# Patient Record
Sex: Female | Born: 1955 | Race: White | Hispanic: No | Smoking: Current every day smoker
Health system: Southern US, Community
[De-identification: ages and names within clinical notes are randomized; demographics above are authoritative.]

## PROBLEM LIST (undated history)

## (undated) DIAGNOSIS — G43909 Migraine, unspecified, not intractable, without status migrainosus: Secondary | ICD-10-CM

## (undated) DIAGNOSIS — M1990 Unspecified osteoarthritis, unspecified site: Secondary | ICD-10-CM

## (undated) DIAGNOSIS — K900 Celiac disease: Secondary | ICD-10-CM

## (undated) DIAGNOSIS — M810 Age-related osteoporosis without current pathological fracture: Secondary | ICD-10-CM

## (undated) DIAGNOSIS — F17200 Nicotine dependence, unspecified, uncomplicated: Secondary | ICD-10-CM

## (undated) HISTORY — PX: TUBAL LIGATION: SHX77

## (undated) HISTORY — PX: ABDOMINAL HYSTERECTOMY: SHX81

---

## 2017-11-12 ENCOUNTER — Emergency Department: Payer: No Typology Code available for payment source

## 2017-11-12 ENCOUNTER — Encounter: Payer: Self-pay | Admitting: *Deleted

## 2017-11-12 ENCOUNTER — Other Ambulatory Visit: Payer: Self-pay

## 2017-11-12 DIAGNOSIS — Y999 Unspecified external cause status: Secondary | ICD-10-CM | POA: Diagnosis not present

## 2017-11-12 DIAGNOSIS — F17210 Nicotine dependence, cigarettes, uncomplicated: Secondary | ICD-10-CM | POA: Insufficient documentation

## 2017-11-12 DIAGNOSIS — S2223XA Sternal manubrial dissociation, initial encounter for closed fracture: Secondary | ICD-10-CM | POA: Insufficient documentation

## 2017-11-12 DIAGNOSIS — M25532 Pain in left wrist: Secondary | ICD-10-CM | POA: Diagnosis present

## 2017-11-12 DIAGNOSIS — Y939 Activity, unspecified: Secondary | ICD-10-CM | POA: Insufficient documentation

## 2017-11-12 DIAGNOSIS — Y929 Unspecified place or not applicable: Secondary | ICD-10-CM | POA: Insufficient documentation

## 2017-11-12 LAB — BASIC METABOLIC PANEL
ANION GAP: 8 (ref 5–15)
BUN: 15 mg/dL (ref 6–20)
CALCIUM: 9.4 mg/dL (ref 8.9–10.3)
CO2: 22 mmol/L (ref 22–32)
Chloride: 107 mmol/L (ref 101–111)
Creatinine, Ser: 0.68 mg/dL (ref 0.44–1.00)
GFR calc Af Amer: >60 mL/min (ref >60)
GFR calc non Af Amer: >60 mL/min (ref >60)
GLUCOSE: 98 mg/dL (ref 65–99)
POTASSIUM: 4.1 mmol/L (ref 3.5–5.1)
Sodium: 137 mmol/L (ref 135–145)

## 2017-11-12 LAB — CBC
HEMATOCRIT: 41.8 % (ref 35.0–47.0)
HEMOGLOBIN: 14.3 g/dL (ref 12.0–16.0)
MCH: 29.9 pg (ref 26.0–34.0)
MCHC: 34.2 g/dL (ref 32.0–36.0)
MCV: 87.4 fL (ref 80.0–100.0)
Platelets: 477 10*3/uL — ABNORMAL HIGH (ref 150–440)
RBC: 4.79 MIL/uL (ref 3.80–5.20)
RDW: 13.5 % (ref 11.5–14.5)
WBC: 9.4 10*3/uL (ref 3.6–11.0)

## 2017-11-12 LAB — TROPONIN I: Troponin I: <0.03 ng/mL (ref <0.03)
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
## 2017-11-12 NOTE — ED Triage Notes (Signed)
Pt back from X-ray reports she needs to make a phone call stepped outside pt walked with steady gait no distress noted

## 2017-11-13 ENCOUNTER — Emergency Department
Admission: EM | Admit: 2017-11-13 | Discharge: 2017-11-13 | Disposition: A | Discharge status: Home / Self Care | Payer: No Typology Code available for payment source | Attending: Emergency Medicine | Admitting: Emergency Medicine

## 2017-11-13 ENCOUNTER — Emergency Department: Payer: No Typology Code available for payment source

## 2017-11-13 ENCOUNTER — Encounter: Payer: Self-pay | Admitting: Radiology

## 2017-11-13 DIAGNOSIS — R0789 Other chest pain: Secondary | ICD-10-CM

## 2017-11-13 DIAGNOSIS — S2223XA Sternal manubrial dissociation, initial encounter for closed fracture: Secondary | ICD-10-CM

## 2017-11-13 HISTORY — DX: Unspecified osteoarthritis, unspecified site: M19.90

## 2017-11-13 HISTORY — DX: Celiac disease: K90.0

## 2017-11-13 HISTORY — DX: Age-related osteoporosis without current pathological fracture: M81.0

## 2017-11-13 HISTORY — DX: Migraine, unspecified, not intractable, without status migrainosus: G43.909

## 2017-11-13 LAB — ETHANOL

## 2017-11-13 MED ORDER — IOPAMIDOL (ISOVUE-300) INJECTION 61%
75.0000 mL | Freq: Once | INTRAVENOUS | Status: AC | PRN
  Administered 2017-11-13: 75 mL via INTRAVENOUS

## 2017-11-13 MED ORDER — OXYCODONE-ACETAMINOPHEN 5-325 MG PO TABS: 2.0000 | ORAL_TABLET | ORAL | 0 refills | Status: DC | PRN

## 2017-11-13 MED ORDER — KETOROLAC TROMETHAMINE 30 MG/ML IJ SOLN
30.0000 mg | Freq: Once | INTRAMUSCULAR | Status: AC
  Administered 2017-11-13: 30 mg via INTRAVENOUS
  Filled 2017-11-13: qty 1

## 2017-11-13 MED ORDER — OXYCODONE-ACETAMINOPHEN 5-325 MG PO TABS
1.0000 | ORAL_TABLET | Freq: Once | ORAL | Status: AC
  Administered 2017-11-13: 1 via ORAL
  Filled 2017-11-13: qty 1

## 2017-11-13 NOTE — Discharge Instructions (Signed)
Please follow up with your primary care physician for further evaluation of your symptoms.  °
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
## 2024-09-30 ENCOUNTER — Observation Stay: Admission: EM | Admit: 2024-09-30 | Discharge: 2024-10-03 | Disposition: A | Discharge status: Skilled Nursing Facility

## 2024-09-30 ENCOUNTER — Encounter: Payer: Self-pay | Admitting: Emergency Medicine

## 2024-09-30 ENCOUNTER — Emergency Department

## 2024-09-30 ENCOUNTER — Other Ambulatory Visit: Payer: Self-pay

## 2024-09-30 DIAGNOSIS — F319 Bipolar disorder, unspecified: Secondary | ICD-10-CM | POA: Diagnosis not present

## 2024-09-30 DIAGNOSIS — D169 Benign neoplasm of bone and articular cartilage, unspecified: Secondary | ICD-10-CM

## 2024-09-30 DIAGNOSIS — Y92009 Unspecified place in unspecified non-institutional (private) residence as the place of occurrence of the external cause: Secondary | ICD-10-CM | POA: Insufficient documentation

## 2024-09-30 DIAGNOSIS — M25571 Pain in right ankle and joints of right foot: Secondary | ICD-10-CM | POA: Diagnosis present

## 2024-09-30 DIAGNOSIS — S82841A Displaced bimalleolar fracture of right lower leg, initial encounter for closed fracture: Principal | ICD-10-CM | POA: Insufficient documentation

## 2024-09-30 DIAGNOSIS — M810 Age-related osteoporosis without current pathological fracture: Secondary | ICD-10-CM | POA: Insufficient documentation

## 2024-09-30 DIAGNOSIS — K900 Celiac disease: Secondary | ICD-10-CM | POA: Diagnosis not present

## 2024-09-30 DIAGNOSIS — W19XXXA Unspecified fall, initial encounter: Secondary | ICD-10-CM | POA: Insufficient documentation

## 2024-09-30 DIAGNOSIS — F17200 Nicotine dependence, unspecified, uncomplicated: Secondary | ICD-10-CM | POA: Diagnosis not present

## 2024-09-30 DIAGNOSIS — G43909 Migraine, unspecified, not intractable, without status migrainosus: Secondary | ICD-10-CM | POA: Diagnosis not present

## 2024-09-30 DIAGNOSIS — S82891A Other fracture of right lower leg, initial encounter for closed fracture: Principal | ICD-10-CM | POA: Diagnosis present

## 2024-09-30 HISTORY — DX: Nicotine dependence, unspecified, uncomplicated: F17.200

## 2024-09-30 MED ORDER — SODIUM CHLORIDE 0.9 % IV BOLUS
500.0000 mL | Freq: Once | INTRAVENOUS | Status: AC
  Administered 2024-09-30: 500 mL via INTRAVENOUS

## 2024-09-30 MED ORDER — LORAZEPAM 2 MG/ML IJ SOLN
1.0000 mg | Freq: Once | INTRAMUSCULAR | Status: AC
  Administered 2024-10-01: 1 mg via INTRAVENOUS
  Filled 2024-09-30: qty 1

## 2024-09-30 MED ORDER — FENTANYL CITRATE (PF) 50 MCG/ML IJ SOSY
50.0000 ug | PREFILLED_SYRINGE | Freq: Once | INTRAMUSCULAR | Status: AC
  Administered 2024-09-30: 50 ug via INTRAVENOUS
  Filled 2024-09-30: qty 1

## 2024-09-30 MED ORDER — OXYCODONE-ACETAMINOPHEN 5-325 MG PO TABS
2.0000 | ORAL_TABLET | ORAL | Status: AC
  Administered 2024-09-30: 2 via ORAL
  Filled 2024-09-30: qty 2

## 2024-09-30 NOTE — ED Notes (Signed)
 Pt given gluten free chips and Debarge sauce as snack per request. Pt also asking for anxiety meds

## 2024-09-30 NOTE — ED Notes (Signed)
 Assisted pt with bedpan. Call light within reach. No further needs a this time.

## 2024-09-30 NOTE — ED Triage Notes (Addendum)
 Pt in from home via [facility] with RLE injury after slip fall in her kitchen tonight. Pt denies any LOC or thinners, denies any head/neck pain. Pt arrives with R ankle deformity and swelling, was unable to bear weight to RLE on scene. +R pedal pulse present, given 75mcg Fentanyl  PTA

## 2024-10-01 ENCOUNTER — Inpatient Hospital Stay

## 2024-10-01 ENCOUNTER — Encounter: Admission: EM | Disposition: A | Discharge status: Skilled Nursing Facility | Payer: Self-pay | Source: Home / Self Care

## 2024-10-01 ENCOUNTER — Encounter: Payer: Self-pay | Admitting: Internal Medicine

## 2024-10-01 ENCOUNTER — Observation Stay

## 2024-10-01 ENCOUNTER — Inpatient Hospital Stay: Admitting: Anesthesiology

## 2024-10-01 DIAGNOSIS — Y92009 Unspecified place in unspecified non-institutional (private) residence as the place of occurrence of the external cause: Secondary | ICD-10-CM | POA: Diagnosis not present

## 2024-10-01 DIAGNOSIS — F319 Bipolar disorder, unspecified: Secondary | ICD-10-CM | POA: Diagnosis present

## 2024-10-01 DIAGNOSIS — G43909 Migraine, unspecified, not intractable, without status migrainosus: Secondary | ICD-10-CM | POA: Diagnosis present

## 2024-10-01 DIAGNOSIS — S82891A Other fracture of right lower leg, initial encounter for closed fracture: Secondary | ICD-10-CM

## 2024-10-01 DIAGNOSIS — F17200 Nicotine dependence, unspecified, uncomplicated: Secondary | ICD-10-CM

## 2024-10-01 DIAGNOSIS — K900 Celiac disease: Secondary | ICD-10-CM | POA: Diagnosis present

## 2024-10-01 DIAGNOSIS — W19XXXA Unspecified fall, initial encounter: Secondary | ICD-10-CM

## 2024-10-01 HISTORY — PX: ORIF ANKLE FRACTURE: SHX5408

## 2024-10-01 LAB — BASIC METABOLIC PANEL WITH GFR
Anion gap: 7 (ref 5–15)
BUN: 12 mg/dL (ref 8–23)
CO2: 26 mmol/L (ref 22–32)
Calcium: 8.8 mg/dL — ABNORMAL LOW (ref 8.9–10.3)
Chloride: 104 mmol/L (ref 98–111)
Creatinine, Ser: 0.73 mg/dL (ref 0.44–1.00)
GFR, Estimated: >60 mL/min
Glucose, Bld: 94 mg/dL (ref 70–99)
Potassium: 4.2 mmol/L (ref 3.5–5.1)
Sodium: 137 mmol/L (ref 135–145)

## 2024-10-01 LAB — CBC
HCT: 37.4 % (ref 36.0–46.0)
Hemoglobin: 12.4 g/dL (ref 12.0–15.0)
MCH: 29.3 pg (ref 26.0–34.0)
MCHC: 33.2 g/dL (ref 30.0–36.0)
MCV: 88.4 fL (ref 80.0–100.0)
Platelets: 496 K/uL — ABNORMAL HIGH (ref 150–400)
RBC: 4.23 MIL/uL (ref 3.87–5.11)
RDW: 13.8 % (ref 11.5–15.5)
WBC: 10.8 K/uL — ABNORMAL HIGH (ref 4.0–10.5)
nRBC: 0 % (ref 0.0–0.2)

## 2024-10-01 LAB — TYPE AND SCREEN
ABO/RH(D): AB POS
Antibody Screen: NEGATIVE

## 2024-10-01 LAB — PROTIME-INR
INR: 1 (ref 0.8–1.2)
Prothrombin Time: 14 s (ref 11.4–15.2)

## 2024-10-01 LAB — APTT: aPTT: 29 s (ref 24–36)

## 2024-10-01 LAB — HIV ANTIBODY (ROUTINE TESTING W REFLEX): HIV Screen 4th Generation wRfx: NONREACTIVE

## 2024-10-01 MED ORDER — LORAZEPAM 0.5 MG PO TABS
0.5000 mg | ORAL_TABLET | Freq: Three times a day (TID) | ORAL | Status: DC | PRN
  Administered 2024-10-02 (×2): 0.5 mg via ORAL
  Filled 2024-10-01 (×2): qty 1

## 2024-10-01 MED ORDER — ACETAMINOPHEN 325 MG PO TABS: 650.0000 mg | ORAL_TABLET | Freq: Four times a day (QID) | ORAL | Status: DC | PRN

## 2024-10-01 MED ORDER — METHOCARBAMOL 500 MG PO TABS
500.0000 mg | ORAL_TABLET | Freq: Three times a day (TID) | ORAL | Status: DC | PRN
  Administered 2024-10-01 – 2024-10-02 (×2): 500 mg via ORAL
  Filled 2024-10-01 (×2): qty 1

## 2024-10-01 MED ORDER — LAMOTRIGINE 25 MG PO TABS
150.0000 mg | ORAL_TABLET | Freq: Two times a day (BID) | ORAL | Status: DC
  Administered 2024-10-01 – 2024-10-03 (×5): 150 mg via ORAL
  Filled 2024-10-01 (×5): qty 6

## 2024-10-01 MED ORDER — BUPIVACAINE HCL (PF) 0.5 % IJ SOLN
INTRAMUSCULAR | Status: DC | PRN
  Administered 2024-10-01 (×2): 10 mL via PERINEURAL

## 2024-10-01 MED ORDER — ONDANSETRON HCL 4 MG/2ML IJ SOLN
INTRAMUSCULAR | Status: DC | PRN
  Administered 2024-10-01: 4 mg via INTRAVENOUS

## 2024-10-01 MED ORDER — MIDAZOLAM HCL 2 MG/2ML IJ SOLN
INTRAMUSCULAR | Status: AC
  Filled 2024-10-01: qty 2

## 2024-10-01 MED ORDER — OXYCODONE-ACETAMINOPHEN 5-325 MG PO TABS
1.0000 | ORAL_TABLET | ORAL | Status: DC | PRN
  Administered 2024-10-01 – 2024-10-03 (×2): 1 via ORAL
  Filled 2024-10-01 (×2): qty 1

## 2024-10-01 MED ORDER — FENTANYL CITRATE (PF) 50 MCG/ML IJ SOSY
25.0000 ug | PREFILLED_SYRINGE | INTRAMUSCULAR | Status: DC | PRN
  Administered 2024-10-01: 25 ug via INTRAVENOUS
  Filled 2024-10-01: qty 1

## 2024-10-01 MED ORDER — DEXAMETHASONE SOD PHOSPHATE PF 10 MG/ML IJ SOLN
INTRAMUSCULAR | Status: DC | PRN
  Administered 2024-10-01: 10 mg via INTRAVENOUS

## 2024-10-01 MED ORDER — NICOTINE 21 MG/24HR TD PT24
21.0000 mg | MEDICATED_PATCH | Freq: Every day | TRANSDERMAL | Status: DC
  Administered 2024-10-01 – 2024-10-03 (×3): 21 mg via TRANSDERMAL
  Filled 2024-10-01 (×3): qty 1

## 2024-10-01 MED ORDER — BUPIVACAINE HCL (PF) 0.5 % IJ SOLN
INTRAMUSCULAR | Status: AC
  Filled 2024-10-01: qty 10

## 2024-10-01 MED ORDER — GLYCOPYRROLATE 0.2 MG/ML IJ SOLN
INTRAMUSCULAR | Status: DC | PRN
  Administered 2024-10-01: .2 mg via INTRAVENOUS

## 2024-10-01 MED ORDER — ONDANSETRON HCL 4 MG/2ML IJ SOLN: 4.0000 mg | Freq: Three times a day (TID) | INTRAMUSCULAR | Status: DC | PRN

## 2024-10-01 MED ORDER — PROPOFOL 10 MG/ML IV BOLUS
INTRAVENOUS | Status: AC
  Filled 2024-10-01: qty 20

## 2024-10-01 MED ORDER — LIDOCAINE 5 % EX PTCH
1.0000 | MEDICATED_PATCH | Freq: Every day | CUTANEOUS | Status: DC
  Administered 2024-10-01: 1 via TRANSDERMAL
  Filled 2024-10-01 (×3): qty 1

## 2024-10-01 MED ORDER — MIDAZOLAM HCL (PF) 2 MG/2ML IJ SOLN
1.0000 mg | INTRAMUSCULAR | Status: DC | PRN
  Administered 2024-10-01: 2 mg via INTRAVENOUS

## 2024-10-01 MED ORDER — PHENYLEPHRINE HCL-NACL 20-0.9 MG/250ML-% IV SOLN
INTRAVENOUS | Status: AC
  Filled 2024-10-01: qty 250

## 2024-10-01 MED ORDER — FENTANYL CITRATE (PF) 50 MCG/ML IJ SOSY
PREFILLED_SYRINGE | INTRAMUSCULAR | Status: AC
  Filled 2024-10-01: qty 1

## 2024-10-01 MED ORDER — CEFAZOLIN SODIUM-DEXTROSE 2-4 GM/100ML-% IV SOLN
2.0000 g | INTRAVENOUS | Status: AC
  Administered 2024-10-01: 2 g via INTRAVENOUS
  Filled 2024-10-01: qty 100

## 2024-10-01 MED ORDER — PHENYLEPHRINE 80 MCG/ML (10ML) SYRINGE FOR IV PUSH (FOR BLOOD PRESSURE SUPPORT)
PREFILLED_SYRINGE | INTRAVENOUS | Status: DC | PRN
  Administered 2024-10-01 (×3): 120 ug via INTRAVENOUS

## 2024-10-01 MED ORDER — LACTATED RINGERS IV SOLN: INTRAVENOUS | Status: AC

## 2024-10-01 MED ORDER — SEVOFLURANE IN SOLN
RESPIRATORY_TRACT | Status: AC
  Filled 2024-10-01: qty 250

## 2024-10-01 MED ORDER — LIDOCAINE HCL (PF) 2 % IJ SOLN
INTRAMUSCULAR | Status: AC
  Filled 2024-10-01: qty 5

## 2024-10-01 MED ORDER — FENTANYL CITRATE (PF) 100 MCG/2ML IJ SOLN
INTRAMUSCULAR | Status: DC | PRN
  Administered 2024-10-01: 25 ug via INTRAVENOUS

## 2024-10-01 MED ORDER — LORAZEPAM 2 MG/ML IJ SOLN
1.0000 mg | Freq: Once | INTRAMUSCULAR | Status: AC
  Administered 2024-10-01: 1 mg via INTRAVENOUS
  Filled 2024-10-01: qty 1

## 2024-10-01 MED ORDER — PHENYLEPHRINE HCL-NACL 20-0.9 MG/250ML-% IV SOLN
INTRAVENOUS | Status: DC | PRN
  Administered 2024-10-01: 20 ug/min via INTRAVENOUS

## 2024-10-01 MED ORDER — SUMATRIPTAN SUCCINATE 50 MG PO TABS
50.0000 mg | ORAL_TABLET | ORAL | Status: DC | PRN
  Filled 2024-10-01: qty 1

## 2024-10-01 MED ORDER — BUPIVACAINE HCL (PF) 0.5 % IJ SOLN: INTRAMUSCULAR | Status: DC | PRN

## 2024-10-01 MED ORDER — GADOBUTROL 1 MMOL/ML IV SOLN
7.5000 mL | Freq: Once | INTRAVENOUS | Status: AC | PRN
  Administered 2024-10-01: 7.5 mL via INTRAVENOUS

## 2024-10-01 MED ORDER — ROCURONIUM BROMIDE 10 MG/ML (PF) SYRINGE
PREFILLED_SYRINGE | INTRAVENOUS | Status: AC
  Filled 2024-10-01: qty 10

## 2024-10-01 MED ORDER — LIDOCAINE HCL (CARDIAC) PF 100 MG/5ML IV SOSY
PREFILLED_SYRINGE | INTRAVENOUS | Status: DC | PRN
  Administered 2024-10-01: 80 mg via INTRAVENOUS

## 2024-10-01 MED ORDER — EPHEDRINE SULFATE-NACL 50-0.9 MG/10ML-% IV SOSY
PREFILLED_SYRINGE | INTRAVENOUS | Status: DC | PRN
  Administered 2024-10-01: 10 mg via INTRAVENOUS

## 2024-10-01 MED ORDER — FENTANYL CITRATE (PF) 100 MCG/2ML IJ SOLN
INTRAMUSCULAR | Status: AC
  Filled 2024-10-01: qty 2

## 2024-10-01 MED ORDER — CEFAZOLIN SODIUM-DEXTROSE 2-4 GM/100ML-% IV SOLN
INTRAVENOUS | Status: AC
  Filled 2024-10-01: qty 100

## 2024-10-01 MED ORDER — LACTATED RINGERS IV SOLN: INTRAVENOUS | Status: DC | PRN

## 2024-10-01 MED ORDER — SENNOSIDES-DOCUSATE SODIUM 8.6-50 MG PO TABS: 1.0000 | ORAL_TABLET | Freq: Every evening | ORAL | Status: DC | PRN

## 2024-10-01 MED ORDER — BUPIVACAINE LIPOSOME 1.3 % IJ SUSP
INTRAMUSCULAR | Status: DC | PRN
  Administered 2024-10-01 (×2): 10 mL via PERINEURAL

## 2024-10-01 MED ORDER — FENTANYL CITRATE (PF) 50 MCG/ML IJ SOSY
50.0000 ug | PREFILLED_SYRINGE | Freq: Once | INTRAMUSCULAR | Status: AC
  Administered 2024-10-01: 50 ug via INTRAVENOUS

## 2024-10-01 MED ORDER — 0.9 % SODIUM CHLORIDE (POUR BTL) OPTIME
TOPICAL | Status: DC | PRN
  Administered 2024-10-01: 500 mL

## 2024-10-01 MED ORDER — PROPOFOL 10 MG/ML IV BOLUS
INTRAVENOUS | Status: DC | PRN
  Administered 2024-10-01: 150 mg via INTRAVENOUS

## 2024-10-01 NOTE — ED Provider Notes (Signed)
 "  [facility] Provider Note    Event Date/Time   First MD Initiated Contact with Patient 09/30/24 2359     (approximate)   History   Ankle Pain   HPI  Jasmine Hines is a 69 y.o. female history of celiac disease ADHD and PTSD  Patient was at her house.  She had water on the floor made cleaning.  She reports she slipped falling awkwardly with her right ankle under her.  No other pain did not strike her head she reports pain immediate in the right ankle and swelling.  She is able to feel the toes wiggle them normally but reports pain just above the right ankle where it is swollen pointing towards the area of swelling.  Reviewed external records from -  Past Medical History:  Diagnosis Date   Celiac disease    Migraines    Osteoarthritis    Osteoporosis      Physical Exam   Triage Vital Signs: ED Triage Vitals  Encounter Vitals Group     BP 09/30/24 1907 129/67     Girls Systolic BP Percentile --      Girls Diastolic BP Percentile --      Boys Systolic BP Percentile --      Boys Diastolic BP Percentile --      Pulse Rate 09/30/24 1907 78     Resp 09/30/24 1907 20     Temp 09/30/24 1907 98.2 F (36.8 C)     Temp Source 09/30/24 1907 Oral     SpO2 09/30/24 1907 100 %     Weight 09/30/24 1915 139 lb 15.9 oz (63.5 kg)     Height --      Head Circumference --      Peak Flow --      Pain Score 09/30/24 1915 10     Pain Loc --      Pain Education --      Exclude from Growth Chart --     Most recent vital signs: Vitals:   09/30/24 2300 09/30/24 2345  BP:  (!) 150/98  Pulse: 74 78  Resp:  18  Temp:    SpO2: 99% 98%     General: Awake, no distress.  Normocephalic atraumatic CV:  Strong palpable pulses right foot dorsalis pedis posterior tibial good peripheral perfusion. Normal rate and heart tones. Resp:   Normal effort. Speaking without distress. Abd:   No distention. Soft, non-tender to palpation in all quadrants. No rebound or  guarding. Neuro:   No focal neuro deficits noted. Moves extremities well without noted concern with exception of right leg which pain limits but able to wiggle toes well normal sensation of the toes of right foot. Other:  There is swelling over the distal tib-fib more laterally.  There is tenderness to palpation there able to actually support her leg and range her hip well without pain or discomfort there are no pain to palpation over the femur or knee or remaining portions of the foot with exception to the right ankle and distal tib-fib area where there is swelling.  Compartments are soft.  There is no open fracture  All toes warm and well-perfused   ED Results / Procedures / Treatments   Labs (all labs ordered are listed, but only abnormal results are displayed) Labs Reviewed - No data to display   EKG  I independently reviewed the EKG    RADIOLOGY I independently reviewed images of right tib-fib/ankle - Minimally  displaced distal fibular fracture.  Based on this also ordered CT to evaluate for additional fracture pattern and morphology   CT 1. Oblique fracture of the lateral tibial metaphysis with fracture line extending to the tibiotalar articular surface and involving the posterior malleolus, with about 1 mm cortical depression at the tibiotalar articulation. 2. Oblique fracture of the distal fibula with mild distraction and about 2 mm lateral displacement of fracture fragments. 3. Avulsion fracture off of the anterior tibial metaphysis. 4. Widening of the anterior tibiotalar joint space. 5. Avulsion fragment medial to the distal talus, likely arising from the navicular. 6. Diffuse soft tissue swelling about the ankle. No loculated collections.   PROCEDURES:  Critical Care performed: No  Procedures   MEDICATIONS ORDERED IN ED: Medications  LORazepam  (ATIVAN ) injection 1 mg (has no administration in time range)  fentaNYL  (SUBLIMAZE ) injection 50 mcg (50 mcg  Intravenous Given 09/30/24 1949)  sodium chloride  0.9 % bolus 500 mL (0 mLs Intravenous Stopped 09/30/24 2150)  oxyCODONE -acetaminophen  (PERCOCET/ROXICET) 5-325 MG per tablet 2 tablet (2 tablets Oral Given 09/30/24 2146)     IMPRESSION / MDM / ASSESSMENT AND PLAN / ED COURSE  I reviewed the triage vital signs and the nursing notes.                              Based on presentation, the differential diagnosis includes, but is not limited to key considerations: Possible acute orthopedic injury including ankle fracture seems high.  Less likely foot or knee.  No evidence of hip injury no other injury normocephalic atraumatic no neck pain did not strike head.  Reassuring neurovascular exam with soft compartments some edema over the lateral ankle  Patient's presentation is most consistent with acute complicated illness / injury requiring diagnostic workup.  Clinical Course as of 10/01/24 0006  Tue Sep 30, 2024  2040 CT imaging with relatively nondisplaced distal fibular fracture.  Will place in long-leg posterior.  Pain well-controlled.  Compartments soft throughout the lower leg.  Patient resting comfortably. [MQ]  2205 Resting comfortably.  Awaiting CT ankle to further evaluate for fracture pattern [MQ]  2206 Patient does live alone.  She reports with pain medication and crutches she think she would do well.  She would like if at all possible to have a cam walker style boot as opposed to full posterior splinting after I discussed the risks and benefits.  Plan to discuss with her podiatrist to see if this would be acceptable at this fracture pattern once we have CT imaging [MQ]  2239 Called and left message requesting callback to Dr. Lennie podiatry.  [facility] secure message sent with patient info as well [MQ]  2254 Discussed with Dr. Lennie, ok with crutches, NWB in RLE and cam walker boot.  [MQ]  2259 Discussed with podiatry Dr. Lennie.  Advises reasonable to have the patient discharge and follow-up in his  clinic closely with CAM Walker boot crutches and nonweightbearing status to right leg or she can stay in he will see her in the morning and plan surgical procedure.  I discussed with the patient she is discussing with a friend and plans to let me know which she thinks will work best based on her home care, assistance with friends, etc. [MQ]  2259 Patient understands that surgery will be recommended.  She is going to follow-up with Dr. Lennie [MQ]  2344 Patient to be admitted.  Dr. Willo discussed with hospitalist.  Have consult with podiatry Dr. Lennie he advises to have the patient n.p.o. at 7 AM and he will see her in the morning.  Patient understand agreeable with plan to move forward, Dr. Lennie advising that likely this will be operative when he plans to extend operative opportunity to her.  She has significant concerns for ongoing home as well as she lives by herself, likely have difficulty with crutches, nonweightbearing etc. [MQ]    Clinical Course User Index [MQ] Dicky Anes, MD     FINAL CLINICAL IMPRESSION(S) / ED DIAGNOSES   Final diagnoses:  Closed fracture of right ankle, initial encounter     Rx / DC Orders   ED Discharge Orders     None        Note:  This document was prepared using Dragon voice recognition software and may include unintentional dictation errors.   Dicky Anes, MD 10/01/24 0006  "

## 2024-10-01 NOTE — Plan of Care (Signed)
  Problem: Education: Goal: Knowledge of General Education information will improve Description: Including pain rating scale, medication(s)/side effects and non-pharmacologic comfort measures Outcome: Progressing   Problem: Clinical Measurements: Goal: Ability to maintain clinical measurements within normal limits will improve Outcome: Progressing Goal: Will remain free from infection Outcome: Progressing   Problem: Coping: Goal: Level of anxiety will decrease Outcome: Progressing   Problem: Pain Managment: Goal: General experience of comfort will improve and/or be controlled Outcome: Progressing   Problem: Safety: Goal: Ability to remain free from injury will improve Outcome: Progressing

## 2024-10-01 NOTE — Progress Notes (Signed)
 Pt otf for surgery

## 2024-10-01 NOTE — H&P (Signed)
 HISTORY AND PHYSICAL INTERVAL NOTE:  10/01/2024  1:05 PM  Jasmine Hines  has presented today for surgery, with the diagnosis of Right ankle fracture.  The various methods of treatment have been discussed with the patient.  No guarantees were given.  After consideration of risks, benefits and other options for treatment, the patient has consented to surgery.  I have reviewed the patients chart and labs.    PROCEDURE: RIGHT ANKLE BIMAL FRACTURE ORIF WITH SYNDESMOTIC REPAIR  A history and physical examination was performed the hospital.  The patient was reexamined.  There have been no changes to this history and physical examination.  Prentice Lee, DPM

## 2024-10-01 NOTE — Consult Note (Signed)
 PODIATRY / FOOT AND ANKLE SURGERY CONSULTATION NOTE  Requesting Physician: Dr. Dicky  Reason for consult: R ankle fracture   HPI: Jasmine Hines is a 69 y.o. female who presents with after a right ankle injury when she slipped and fell when cleaning yesterday evening.  Patient went to the emergency room via EMS.  Patient had x-ray imaging taken which showed a distal fibular fracture with syndesmotic disruption and posterior malleolar fracture.  Patient was admitted to the hospital due to concerns for social situation as patient lives by herself and is independent.  Patient presents today resting in bed fairly comfortably not complaining of much pain overall to the right ankle.  She does have a lidocaine  patch on the right ankle.  Patient is concerned with postoperative course if she does have surgical intervention.  Patient does have a history of smoking but notes that she rarely does any more.  PMHx:  Past Medical History:  Diagnosis Date   Celiac disease    Migraines    Osteoarthritis    Osteoporosis    Smoker     Surgical Hx:  Past Surgical History:  Procedure Laterality Date   ABDOMINAL HYSTERECTOMY     TUBAL LIGATION      FHx:  Family History  Problem Relation Age of Onset   CAD Mother    Diabetes Father    Multiple myeloma Father    Hypertension Brother     Social History:  reports that she has been smoking. She has never used smokeless tobacco. She reports current alcohol use. She reports that she does not use drugs.  Allergies: Allergies[1]   Medications Prior to Admission  Medication Sig Dispense Refill   oxyCODONE -acetaminophen  (PERCOCET/ROXICET) 5-325 MG tablet Take 2 tablets by mouth every 4 (four) hours as needed for severe pain. 12 tablet 0    Physical Exam: General: Alert and oriented.  No apparent distress.  Vascular: DP/PT pulses palpable bilateral.  Capillary fill time intact to digits bilaterally.  Minimal hair growth present to bilateral lower  extremities.  Mild swelling present to the right ankle.  Neuro: Light touch sensation intact bilateral lower lower extremities.  Derm: Skin integrity appears to be intact, no fracture blisters or openings to the right lower extremity. . MSK: Deferred range of motion palpation testing of the right ankle.  No obvious deformity present to right ankle with visual inspection today.  Results for orders placed or performed during the hospital encounter of 09/30/24 (from the past 48 hours)  Protime-INR     Status: None   Collection Time: 10/01/24  5:50 AM  Result Value Ref Range   Prothrombin Time 14.0 11.4 - 15.2 seconds   INR 1.0 0.8 - 1.2    Comment: (NOTE) INR goal varies based on device and disease states. Performed at [facility], [address] [place], [place] [address]   APTT     Status: None   Collection Time: 10/01/24  5:50 AM  Result Value Ref Range   aPTT 29 24 - 36 seconds    Comment: Performed at [facility], [address] [place], [place] [address]  Type and screen [facility]     Status: None   Collection Time: 10/01/24  5:50 AM  Result Value Ref Range   ABO/RH(D) AB POS    Antibody Screen NEG    Sample Expiration      10/04/2024,2359 Performed at [facility], [address], [place], [place] [address]  CBC     Status: Abnormal   Collection Time: 10/01/24  5:50 AM  Result Value Ref Range   WBC 10.8 (H) 4.0 - 10.5 K/uL   RBC 4.23 3.87 - 5.11 MIL/uL   Hemoglobin 12.4 12.0 - 15.0 g/dL   HCT 62.5 63.9 - 53.9 %   MCV 88.4 80.0 - 100.0 fL   MCH 29.3 26.0 - 34.0 pg   MCHC 33.2 30.0 - 36.0 g/dL   RDW 86.1 88.4 - 84.4 %   Platelets 496 (H) 150 - 400 K/uL   nRBC 0.0 0.0 - 0.2 %    Comment: Performed at [facility], [address], [place], [place] [address]  Basic metabolic panel with GFR     Status: Abnormal   Collection Time: 10/01/24  5:50 AM  Result Value Ref Range   Sodium 137 135 - 145 mmol/L    Potassium 4.2 3.5 - 5.1 mmol/L   Chloride 104 98 - 111 mmol/L   CO2 26 22 - 32 mmol/L   Glucose, Bld 94 70 - 99 mg/dL    Comment: Glucose reference range applies only to samples taken after fasting for at least 8 hours.   BUN 12 8 - 23 mg/dL   Creatinine, Ser 9.26 0.44 - 1.00 mg/dL   Calcium 8.8 (L) 8.9 - 10.3 mg/dL   GFR, Estimated >39 >39 mL/min    Comment: (NOTE) Calculated using the CKD-EPI Creatinine Equation (2021)    Anion gap 7 5 - 15    Comment: Performed at [facility], [address] [place], [place] [address]   MR TIBIA FIBULA RIGHT W WO CONTRAST Result Date: 10/01/2024 EXAM: MR RIGHT LOWER EXTREMITY WITH AND WITHOUT INTRAVENOUS CONTRAST 10/01/2024 03:32:12 AM TECHNIQUE: Multiplanar magnetic resonance images of the right lower extremity with and without intravenous contrast. CONTRAST: 7.5 mL of Gadavist . COMPARISON: None provided. CLINICAL HISTORY: Ankle fractures, proximal tibial lesion. FINDINGS: SOFT TISSUES: Expected surrounding edema tracking along adjacent fascial planes and in the adjacent subcutaneous tissues. JOINTS: Stage 4 Weber B fracture pattern in the ankle. Expected surrounding edema tracking along adjacent fascial planes and in the adjacent subcutaneous tissues. BONES: 1.7 x 1.4 x 1.4 cm T2 hyperintense lesion of the proximal tibial metadiaphysis with sharply defined and benign imaging characteristics favoring benign enchondroma over benign hemangioma. Stage 4 Weber B fracture pattern in the ankle. LIMITATIONS: Motion artifact. IMPRESSION: 1. Stage 4 Weber B fracture pattern in the ankle with expected surrounding edema. 2. 1.7 x 1.4 x 1.4 cm T2 hyperintense lesion of the proximal tibial metadiaphysis with sharply defined benign imaging characteristics, favoring enchondroma over hemangioma. Electronically signed by: Ryan Salvage MD 10/01/2024 10:56 AM EST RP Workstation: [id number]   CT Ankle Right Wo Contrast Result Date: 09/30/2024 EXAM: CT RIGHT  ANKLE, WITHOUT IV CONTRAST 09/30/2024 10:09:43 PM TECHNIQUE: Axial images were acquired through the right ankle without IV contrast. Reformatted images were reviewed. Automated exposure control, iterative reconstruction, and/or weight based adjustment of the mA/kV was utilized to reduce the radiation dose to as low as reasonably achievable. COMPARISON: Right ankle radiographs 02/23/2025 . CLINICAL HISTORY: Ankle trauma, fracture, xray done (Age >= 5y). Slip and fall injury. FINDINGS: BONES: Oblique fracture of the distal fibula with mild distraction of fracture fragments and about 2 mm lateral displacement of the distal fracture fragment. Oblique fracture of the lateral tibial metaphysis with fracture line extending to the tibiotalar articular surface. Fracture lines extend to involve the posterior malleolus. There is about 1 mm cortical  depression at the tibiotalar articulation. Avulsion fracture off of the anterior tibial metaphysis. Avulsion fragment medial to the distal talus, likely arising from the navicular. Corticated osseous fragment adjacent to the cuboid bone, likely old and united ossicle. JOINTS: No dislocation. Widening of the anterior tibiotalar joint space. The joint spaces are otherwise normal. SOFT TISSUES: Diffuse soft tissue swelling about the ankle. No loculated collections. IMPRESSION: 1. Oblique fracture of the lateral tibial metaphysis with fracture line extending to the tibiotalar articular surface and involving the posterior malleolus, with about 1 mm cortical depression at the tibiotalar articulation. 2. Oblique fracture of the distal fibula with mild distraction and about 2 mm lateral displacement of fracture fragments. 3. Avulsion fracture off of the anterior tibial metaphysis. 4. Widening of the anterior tibiotalar joint space. 5. Avulsion fragment medial to the distal talus, likely arising from the navicular. 6. Diffuse soft tissue swelling about the ankle. No loculated collections.  Electronically signed by: Elsie Gravely MD 09/30/2024 10:20 PM EST RP Workstation: [id number]   DG Ankle Complete Right Result Date: 09/30/2024 EXAM: 3 OR MORE VIEW(S) XRAY OF THE RIGHT ANKLE 09/30/2024 07:50:24 PM CLINICAL HISTORY: fall, deformity distal tib fib area COMPARISON: None available. FINDINGS: BONES AND JOINTS: Minimally displaced oblique fracture of the distal fibular metaphysis. No malalignment. SOFT TISSUES: Diffuse soft tissue swelling of the distal lower leg. IMPRESSION: 1. Minimally displaced oblique fracture of the distal fibular metaphysis. 2. Diffuse soft tissue swelling of the distal lower leg. Electronically signed by: Elsie Gravely MD 09/30/2024 08:13 PM EST RP Workstation: [id number]   DG Tibia/Fibula Right Result Date: 09/30/2024 EXAM: VIEW(S) XRAY OF THE RIGHT TIBIA AND FIBULA 09/30/2024 07:50:24 PM COMPARISON: None available. CLINICAL HISTORY: fall, deformity distal tib fib area FINDINGS: BONES AND JOINTS: Osteopenia. Slightly displaced acute closed distal fibular shaft fracture. No further evidence of fractures in the tibia or fibula . There is a 1.8 x 1.5 x 2.2 cm intramedullary lesion in the proximal tibial metaphysis, which is ill-defined with a calcific matrix and is probably a chondroid lesion or a chronic bone infarct. . . Consider MRI follow-up to assess the potential significance. No malalignment. SOFT TISSUES: Moderate edema anteriorly in the distal foreleg extending to the ankle. IMPRESSION: 1. Slightly displaced acute distal fibular shaft fracture. 2. Moderate edema anteriorly in the distal foreleg extending to the ankle. 3. Ill-defined 1.8 x 1.5 x 2.2 cm intramedullary lesion with calcific matrix in the proximal tibial metaphysis, possibly a chondroid lesion or chronic bone infarct; consider MRI follow-up to assess the potential significance. Electronically signed by: Francis Quam MD 09/30/2024 08:11 PM EST RP Workstation: [id number]   DG Foot 2 Views Right Result  Date: 09/30/2024 EXAM: 2 VIEW(S) XRAY OF THE RIGHT FOOT 09/30/2024 07:50:24 PM COMPARISON: None available. CLINICAL HISTORY: fall, deformity distal tib fib area FINDINGS: BONES AND JOINTS: There is osteopenia without evidence of displaced fractures. No malalignment. There is mild arthrosis of the 1st MTP and toe joints. Arthritic changes are not seen significantly elsewhere. There are small plantar and dorsal calcaneal spurs. SOFT TISSUES: There is moderate edema in the anterior distal foreleg and circumferentially at the ankle and hindfoot. No visible foreign body or soft tissue gas. IMPRESSION: 1. No displaced fracture. 2. Osteopenia and degenerative change. 3. Moderate soft tissue edema in the anterior distal foreleg and circumferentially at the ankle and hindfoot. Electronically signed by: Francis Quam MD 09/30/2024 08:05 PM EST RP Workstation: [id number]    Blood pressure 108/62, pulse 60, temperature 98.3 F (36.8 C),  temperature source Oral, resp. rate 18, height 5' 2 (1.575 m), weight 73.7 kg, SpO2 100%.  Assessment Right ankle bimalleolar fracture closed, mildly displaced Right ankle syndesmotic disruption  Plan -Patient seen and examined - X-ray and CT scan reviewed. - Discussed treatment options with patient.  Discussed conservative care consisting of 6 weeks nonweightbearing followed by weightbearing in a boot for around a month and then transition to ASO ankle brace.  Patient would not be able to drive during that time until transitioning to normal supportive shoe with ASO brace.  Discussed with patient that I do not believe conservative care is the best course as the fracture is slightly displaced in the syndesmotic ligament is ruptured.  Would recommend surgical intervention for this type of situation.  Postoperative course would be the same as conservative care. -All treatment options were discussed with the patient of both conservative and surgical attempts at correction including  potential risks and complications.  Patient has elected for procedure consisting of right bimalleolar ankle fracture open reduction with internal fixation with syndesmotic ligament repair.  No guarantees given.  Consent obtained.  No guarantees given. - Patient has been n.p.o. since 7 AM this morning.  Plan for surgical intervention this afternoon around 4 PM.  Discussed preoperative popliteal nerve block performed by anesthesia.  Patient agreeable. - PT/OT order placed.  Patient could need skilled nursing facility rehab placement postop depending on their assessment.  Patient is independent lives on her own and would not be able to drive for 8 to 10 weeks.   Prentice Lee, DPM 10/01/2024, 12:52 PM         [1]  Allergies Allergen Reactions   Codeine Other (See Comments)

## 2024-10-01 NOTE — Progress Notes (Addendum)
 " PROGRESS NOTE    Jasmine Hines  FMW:[id number] DOB: [birth year] DOA: 09/30/2024 PCP: Donavon Corrie BIRCH, NP  Chief Complaint  Patient presents with   Ankle Pain    Hospital Course:  Jasmine Hines is a 69 y.o. female with medical history significant of bipolar disorder, PTSD, smoker, celiac disease, migraine headache, osteoporosis, presented with mechanical fall and right ankle pain.  Denies head and neck injury.  Seen by podiatry, hospital course as below  Subjective: Patient was examined at bedside, deneis any complaints today  Requiring assistance with ambulation due to non weightbearing status RN reported that patient had loss of balance and had a fall during assistance to the bathroom, denies any injury Fall precautions   Objective: Vitals:   10/01/24 0100 10/01/24 0128 10/01/24 0530 10/01/24 0819  BP: 107/66 117/64 105/69 108/62  Pulse: 62 62  60  Resp: 18 18 18 18   Temp:  98.3 F (36.8 C) 97.7 F (36.5 C) 98.3 F (36.8 C)  TempSrc:   Oral Oral  SpO2: 95% 97% 95% 100%  Weight:  73.7 kg    Height:  5' 2 (1.575 m)      Intake/Output Summary (Last 24 hours) at 10/01/2024 0934 Last data filed at 09/30/2024 2150 Gross per 24 hour  Intake 500 ml  Output --  Net 500 ml   Filed Weights   09/30/24 1915 10/01/24 0128  Weight: 63.5 kg 73.7 kg    Examination: General: Not in acute distress Cardiac: S1/S2, RRR, No murmurs, No gallops or rubs Respiratory: No rales, wheezing, rhonchi or rubs GI: Soft, nondistended, nontender, no rebound pain, no organomegaly, BS present. Ext: No pitting leg edema bilaterally. 1+DP/PT pulse bilaterally Musculoskeletal: No joint deformities, No joint redness or warmth, no limitation of ROM in spin Skin: No rashes.  Neuro: Alert, oriented X3, cranial nerves II-XII grossly intact, moves all extremities normally. Muscle strength 5/5 in all extremities, sensation to light touch intact. Brachial reflex 2+ bilaterally. Knee reflex 1+  bilaterally. Negative Babinski's sign. Normal finger to nose test.  Assessment & Plan:  Closed right ankle fracture:  Seen by podiatry, appreciate recs S/p ORIF with syndesmotic repair 01/07 Pain management, antiemetics as needed RLE NWB Follow-up in clinic outpatient with Dr. Lennie in 1 week  Mechanical Fall at home, initial encounter -Fall precaution PT/OT rec SNF  Leukocytosis - reactive Suspect reactive postoperative Denies symptoms/signs of infection Monitor WBC  Migraines -As needed sumatriptan    Celiac disease: No nausea, vomiting, diarrhea or abdominal pain. gluten-free diet   Smoker: Patient states that she already cut down smoking -Nicotine  patch   Bipolar disorder (HCC) - Continue Lamictal , as needed Ativan    Abnormal findings of x-ray of right tibia/fibula: X-ray showed Ill-defined 1.8 x 1.5 x 2.2 cm intramedullary lesion with calcific matrix in the proximal tibial metaphysis. Etiology is not clear. Per radiologist, this is possibly a chondroid lesion or chronic bone infarct. MRI tib-fib shows stage IV Weber B fracture pattern in the ankle.  1.7 x 1.4 x 1.4 cm hyperintense lesion of the proximal tibial metadiaphysis, favoring enchondroma over hemangioma Follow-up with orthopedic outpatient   DVT prophylaxis: Lovenox  SQ   Code Status: Full Code Disposition:  SNF  Consultants:  Podiatry  Procedures:  ORIF with syndesmotic repair 01/07  Antimicrobials:  Anti-infectives (From admission, onward)    None       Data Reviewed: I have personally reviewed following labs and imaging studies CBC: Recent Labs  Lab 10/01/24 0550  WBC 10.8*  HGB 12.4  HCT 37.4  MCV 88.4  PLT 496*   Basic Metabolic Panel: Recent Labs  Lab 10/01/24 0550  NA 137  K 4.2  CL 104  CO2 26  GLUCOSE 94  BUN 12  CREATININE 0.73  CALCIUM 8.8*   GFR: Estimated Creatinine Clearance: 63.2 mL/min (by C-G formula based on SCr of 0.73 mg/dL). Liver Function Tests: No  results for input(s): AST, ALT, ALKPHOS, BILITOT, PROT, ALBUMIN in the last 168 hours. CBG: No results for input(s): GLUCAP in the last 168 hours.  No results found for this or any previous visit (from the past 240 hours).   Radiology Studies: CT Ankle Right Wo Contrast Result Date: 09/30/2024 EXAM: CT RIGHT ANKLE, WITHOUT IV CONTRAST 09/30/2024 10:09:43 PM TECHNIQUE: Axial images were acquired through the right ankle without IV contrast. Reformatted images were reviewed. Automated exposure control, iterative reconstruction, and/or weight based adjustment of the mA/kV was utilized to reduce the radiation dose to as low as reasonably achievable. COMPARISON: Right ankle radiographs 02/23/2025 . CLINICAL HISTORY: Ankle trauma, fracture, xray done (Age >= 5y). Slip and fall injury. FINDINGS: BONES: Oblique fracture of the distal fibula with mild distraction of fracture fragments and about 2 mm lateral displacement of the distal fracture fragment. Oblique fracture of the lateral tibial metaphysis with fracture line extending to the tibiotalar articular surface. Fracture lines extend to involve the posterior malleolus. There is about 1 mm cortical depression at the tibiotalar articulation. Avulsion fracture off of the anterior tibial metaphysis. Avulsion fragment medial to the distal talus, likely arising from the navicular. Corticated osseous fragment adjacent to the cuboid bone, likely old and united ossicle. JOINTS: No dislocation. Widening of the anterior tibiotalar joint space. The joint spaces are otherwise normal. SOFT TISSUES: Diffuse soft tissue swelling about the ankle. No loculated collections. IMPRESSION: 1. Oblique fracture of the lateral tibial metaphysis with fracture line extending to the tibiotalar articular surface and involving the posterior malleolus, with about 1 mm cortical depression at the tibiotalar articulation. 2. Oblique fracture of the distal fibula with mild distraction  and about 2 mm lateral displacement of fracture fragments. 3. Avulsion fracture off of the anterior tibial metaphysis. 4. Widening of the anterior tibiotalar joint space. 5. Avulsion fragment medial to the distal talus, likely arising from the navicular. 6. Diffuse soft tissue swelling about the ankle. No loculated collections. Electronically signed by: Elsie Gravely MD 09/30/2024 10:20 PM EST RP Workstation: [id number]   DG Ankle Complete Right Result Date: 09/30/2024 EXAM: 3 OR MORE VIEW(S) XRAY OF THE RIGHT ANKLE 09/30/2024 07:50:24 PM CLINICAL HISTORY: fall, deformity distal tib fib area COMPARISON: None available. FINDINGS: BONES AND JOINTS: Minimally displaced oblique fracture of the distal fibular metaphysis. No malalignment. SOFT TISSUES: Diffuse soft tissue swelling of the distal lower leg. IMPRESSION: 1. Minimally displaced oblique fracture of the distal fibular metaphysis. 2. Diffuse soft tissue swelling of the distal lower leg. Electronically signed by: Elsie Gravely MD 09/30/2024 08:13 PM EST RP Workstation: [id number]   DG Tibia/Fibula Right Result Date: 09/30/2024 EXAM: VIEW(S) XRAY OF THE RIGHT TIBIA AND FIBULA 09/30/2024 07:50:24 PM COMPARISON: None available. CLINICAL HISTORY: fall, deformity distal tib fib area FINDINGS: BONES AND JOINTS: Osteopenia. Slightly displaced acute closed distal fibular shaft fracture. No further evidence of fractures in the tibia or fibula . There is a 1.8 x 1.5 x 2.2 cm intramedullary lesion in the proximal tibial metaphysis, which is ill-defined with a calcific matrix and is probably a chondroid lesion or a chronic bone  infarct. . . Consider MRI follow-up to assess the potential significance. No malalignment. SOFT TISSUES: Moderate edema anteriorly in the distal foreleg extending to the ankle. IMPRESSION: 1. Slightly displaced acute distal fibular shaft fracture. 2. Moderate edema anteriorly in the distal foreleg extending to the ankle. 3. Ill-defined 1.8 x  1.5 x 2.2 cm intramedullary lesion with calcific matrix in the proximal tibial metaphysis, possibly a chondroid lesion or chronic bone infarct; consider MRI follow-up to assess the potential significance. Electronically signed by: Francis Quam MD 09/30/2024 08:11 PM EST RP Workstation: [id number]   DG Foot 2 Views Right Result Date: 09/30/2024 EXAM: 2 VIEW(S) XRAY OF THE RIGHT FOOT 09/30/2024 07:50:24 PM COMPARISON: None available. CLINICAL HISTORY: fall, deformity distal tib fib area FINDINGS: BONES AND JOINTS: There is osteopenia without evidence of displaced fractures. No malalignment. There is mild arthrosis of the 1st MTP and toe joints. Arthritic changes are not seen significantly elsewhere. There are small plantar and dorsal calcaneal spurs. SOFT TISSUES: There is moderate edema in the anterior distal foreleg and circumferentially at the ankle and hindfoot. No visible foreign body or soft tissue gas. IMPRESSION: 1. No displaced fracture. 2. Osteopenia and degenerative change. 3. Moderate soft tissue edema in the anterior distal foreleg and circumferentially at the ankle and hindfoot. Electronically signed by: Francis Quam MD 09/30/2024 08:05 PM EST RP Workstation: [id number]    Scheduled Meds:  lamoTRIgine   150 mg Oral BID   lidocaine   1 patch Transdermal QHS   nicotine   21 mg Transdermal Daily   Continuous Infusions:   LOS: 0 days  MDM: Patient is high risk for one or more organ failure.  They necessitate ongoing hospitalization for continued IV therapies and subsequent lab monitoring. Total time spent interpreting labs and vitals, reviewing the medical record, coordinating care amongst consultants and care team members, directly assessing and discussing care with the patient and/or family: 55 min Laree Lock, MD [facility]  To contact the attending physician between 7A-7P please use Epic Chat. To contact the covering physician during after hours 7P-7A, please review Amion.   10/01/2024, 9:34 AM   *This document has been created with the assistance of dictation software. Please excuse typographical errors. *   "

## 2024-10-01 NOTE — Progress Notes (Signed)
 CHG bath completed and consent signed

## 2024-10-01 NOTE — ED Notes (Signed)
Attending rounding at bedside °

## 2024-10-01 NOTE — Anesthesia Preprocedure Evaluation (Addendum)
"                                    Anesthesia Evaluation  Patient identified by MRN, date of birth, ID band Patient awake    Reviewed: Allergy & Precautions, H&P , NPO status , Patient's Chart, lab work & pertinent test results  Airway Mallampati: II  TM Distance: >3 FB Neck ROM: full    Dental no notable dental hx.    Pulmonary shortness of breath and with exertion, Current Smoker and Patient abstained from smoking.   Pulmonary exam normal        Cardiovascular negative cardio ROS Normal cardiovascular exam     Neuro/Psych  PSYCHIATRIC DISORDERS      negative neurological ROS     GI/Hepatic Neg liver ROS,,,Celiac disease   Endo/Other  negative endocrine ROS    Renal/GU negative Renal ROS  negative genitourinary   Musculoskeletal   Abdominal Normal abdominal exam  (+)   Peds  Hematology negative hematology ROS (+)   Anesthesia Other Findings Past Medical History: No date: Celiac disease No date: Migraines No date: Osteoarthritis No date: Osteoporosis No date: Smoker  Past Surgical History: No date: ABDOMINAL HYSTERECTOMY No date: TUBAL LIGATION  BMI    Body Mass Index: 29.72 kg/m      Reproductive/Obstetrics negative OB ROS                              Anesthesia Physical Anesthesia Plan  ASA: 3  Anesthesia Plan: General   Post-op Pain Management: Regional block*   Induction: Intravenous  PONV Risk Score and Plan: Propofol  infusion and TIVA  Airway Management Planned: Natural Airway  Additional Equipment:   Intra-op Plan:   Post-operative Plan:   Informed Consent: I have reviewed the patients History and Physical, chart, labs and discussed the procedure including the risks, benefits and alternatives for the proposed anesthesia with the patient or authorized representative who has indicated his/her understanding and acceptance.     Dental Advisory Given  Plan Discussed with: CRNA and  Surgeon  Anesthesia Plan Comments:          Anesthesia Quick Evaluation  "

## 2024-10-01 NOTE — Transfer of Care (Signed)
 Immediate Anesthesia Transfer of Care Note  Patient: Jasmine Hines  Procedure(s) Performed: OPEN REDUCTION INTERNAL FIXATION (ORIF) ANKLE FRACTURE (Right: Ankle)  Patient Location: PACU  Anesthesia Type:General and Regional  Level of Consciousness: awake, alert , and oriented  Airway & Oxygen Therapy: Patient Spontanous Breathing and Patient connected to nasal cannula oxygen  Post-op Assessment: Report given to RN, Post -op Vital signs reviewed and stable, and Patient moving all extremities  Post vital signs: Reviewed and stable  Last Vitals:  Vitals Value Taken Time  BP    Temp    Pulse 76 10/01/24 18:27  Resp 16 10/01/24 18:27  SpO2 100 % 10/01/24 18:27  Vitals shown include unfiled device data.  Last Pain:  Vitals:   10/01/24 1546  TempSrc: Temporal  PainSc: 3          Complications: No notable events documented.

## 2024-10-01 NOTE — Progress Notes (Signed)
" ° ° °  PROCEDURAL EXPEDITER PROGRESS NOTE  Patient Name: Jasmine Hines  DOB:[birth year] Date of Admission: 09/30/2024  Date of Assessment:10/01/2024   -------------------------------------------------------------------------------------------------------------------   Brief clinical summary: 69 yr old female  with Hx of PTSD, smoker, celiac disease, migraine headache.  Pt fell and injury ankle.  Having surgery today for open reduction internal  internam fisationof fight ankle   Orders in place:    Yes Communication with surgical team if no orders: n/a  Labs, test, and orders reviewed: yes  Requires surgical clearance:  No  What type of clearance: n/a  Clearance received: n/a  Barriers noted:n/a   Intervention provided by [facility] team: n/a  Barrier resolved:  not applicable   -------------------------------------------------------------------------------------------------------------------  [employer], Ronal DELENA Bald Please contact us  directly via secure chat (search for [facility]) or by calling us  at [phone] [facility]).  "

## 2024-10-01 NOTE — Progress Notes (Signed)
 Was examined at the bedside, plan for ORIF with syndesmotic repair today Rest of the details per H&P today  Closed right ankle fracture:  as shown by X-ray and CT scan of right ankle above.  No neurovascular compromise Seen by podiatry, appreciate recs Plan for ORIF with syndesmotic repair 01/07 Pain management, antiemetics as needed 6 weeks nonweightbearing  Mechanical Fall at home, initial encounter -Fall precaution PT/OT tomorrow  Migraines -As needed sumatriptan    Celiac disease: No nausea, vomiting, diarrhea or abdominal pain. gluten-free diet   Smoker: Patient states that she already cut down smoking -Nicotine  patch   Bipolar disorder (HCC) - Continue Lamictal  150 mg twice daily - Add as needed Ativan  0.5 mg 3 times daily   Abnormal findings of x-ray of right tibia/fibula: X-ray showed Ill-defined 1.8 x 1.5 x 2.2 cm intramedullary lesion with calcific matrix in the proximal tibial metaphysis. Etiology is not clear. Per radiologist, this is possibly a chondroid lesion or chronic bone infarct. MRI tib-fib shows stage IV Weber B fracture pattern in the ankle.  1.7 x 1.4 x 1.4 cm hyperintense lesion of the proximal tibial metadiaphysis, favoring enchondroma over hemangioma  -- No charge

## 2024-10-01 NOTE — Care Management CC44 (Signed)
"         Condition Code 44 Documentation Completed  Patient Details  Name: KIMELA MALSTROM MRN: [id number] Date of Birth: 1955/12/28   Condition Code 44 given:  Yes Patient signature on Condition Code 44 notice:  Yes Documentation of 2 MD's agreement:  Yes Code 44 added to claim:  Yes    Bradly Sangiovanni, LCSW 10/01/2024, 3:00 PM  "

## 2024-10-01 NOTE — TOC CM/SW Note (Signed)
 Transition of Care [facility]) CM/SW Note    Transition of Care [facility]) - Inpatient Brief Assessment   Patient Details  Name: Jasmine Hines MRN: [id number] Date of Birth: Apr 06, 1956  Transition of Care [facility]) CM/SW Contact:    Alvaro Louder, LCSW Phone Number: 10/01/2024, 9:10 AM   Clinical Narrative: Per chart review TOC consulted for  Home health and DME. LCSWA to follow recommendations placed by PT and OT.  TOC to follow for discharge   Transition of Care Asessment: Insurance and Status: Insurance coverage has been reviewed Patient has primary care physician: Yes Home environment has been reviewed: Single Family home Prior level of function:: Ox4 Prior/Current Home Services: No current home services Social Drivers of Health Review: SDOH reviewed no interventions necessary Readmission risk has been reviewed: Yes Transition of care needs: no transition of care needs at this time

## 2024-10-01 NOTE — Anesthesia Procedure Notes (Signed)
 Anesthesia Regional Block: Adductor canal block   Pre-Anesthetic Checklist: , timeout performed,  Correct Patient, Correct Site, Correct Laterality,  Correct Procedure, Correct Position, site marked,  Risks and benefits discussed,  Surgical consent,  Pre-op evaluation,  At surgeon's request and post-op pain management  Laterality: Right and Lower  Prep: chloraprep       Needles:  Injection technique: Single-shot  Needle Type: Stimiplex     Needle Length: 9cm  Needle Gauge: 22     Additional Needles:   Procedures:,,,, ultrasound used (permanent image in chart),,    Narrative:  Start time: 10/01/2024 4:05 PM End time: 10/01/2024 4:07 PM Injection made incrementally with aspirations every 5 mL.  Performed by: Personally  Anesthesiologist: Vicci Camellia Glatter, MD  Additional Notes: Patient consented for risk and benefits of nerve block including but not limited to nerve damage, failed block, bleeding and infection.  Patient voiced understanding.  Functioning IV was confirmed and monitors were applied.  Timeout done prior to procedure and prior to any sedation being given to the patient.  Patient confirmed procedure site prior to any sedation given to the patient. Sterile prep,hand hygiene and sterile gloves were used.  Minimal sedation used for procedure.  No paresthesia endorsed by patient during the procedure.  Negative aspiration and negative test dose prior to incremental administration of local anesthetic. The patient tolerated the procedure well with no immediate complications.

## 2024-10-01 NOTE — H&P (Signed)
 " History and Physical    Jasmine Hines FMW:[id number] DOB: 1956-06-18 DOA: 09/30/2024  Referring MD/NP/PA:   PCP: Guiteau-Laurent, Angelique D, NP   Patient coming from:  The patient is coming from home.     Chief Complaint: fall and right ankle pain.    HPI: Jasmine Hines is a 69 y.o. female with medical history significant of bipolar disorder, PTSD, smoker, celiac disease, migraine headache, osteoporosis, who presents with fall and right ankle pain.  Pt states that she slipped on the wet floor in her kitchen this evening, falling awkwardly with her right ankle under her.  No LOC.  She is 100% sure that she did not injure her head and neck.  No headache or neck pain.  She developed pain in the right ankle, which is constant, severe, sharp, nonradiating, aggravated by movement, associated with swelling.  Patient does not have chest pain, cough, SOB.  No nausea, vomiting, diarrhea or abdominal pain.  No symptoms UTI.  No fever or chills.  She denies drinking alcohol.  Patient has some anxiety during the interview.   Data reviewed independently and ED Course: pt was found to have pending CBC and BMP.  Temperature normal, blood pressure 150/98, heart rate 78, RR 20, oxygen saturation 98% on room air.  Patient is admitted to MedSurg bed as inpatient.  Dr. Lennie of podiatry is consulted by EDP.  CT-right ankle: 1. Oblique fracture of the lateral tibial metaphysis with fracture line extending to the tibiotalar articular surface and involving the posterior malleolus, with about 1 mm cortical depression at the tibiotalar articulation. 2. Oblique fracture of the distal fibula with mild distraction and about 2 mm lateral displacement of fracture fragments. 3. Avulsion fracture off of the anterior tibial metaphysis. 4. Widening of the anterior tibiotalar joint space. 5. Avulsion fragment medial to the distal talus, likely arising from the navicular. 6. Diffuse soft tissue swelling about the  ankle. No loculated collections.    X-ray of right foot: 1. No displaced fracture. 2. Osteopenia and degenerative change. 3. Moderate soft tissue edema in the anterior distal foreleg and circumferentially at the ankle and hindfoot.   X-ray of right ankle: 1. Minimally displaced oblique fracture of the distal fibular metaphysis. 2. Diffuse soft tissue swelling of the distal lower leg.   X-ray of her right tibia/fibula: 1. Slightly displaced acute distal fibular shaft fracture. 2. Moderate edema anteriorly in the distal foreleg extending to the ankle. 3. Ill-defined 1.8 x 1.5 x 2.2 cm intramedullary lesion with calcific matrix in the proximal tibial metaphysis, possibly a chondroid lesion or chronic bone infarct; consider MRI follow-up to assess the potential significance   EKG:  Not done in ED, will get one.     Review of Systems:   General: no fevers, chills, no body weight gain, has poor appetite, has fatigue HEENT: no blurry vision, hearing changes or sore throat Respiratory: no dyspnea, coughing, wheezing CV: no chest pain, no palpitations GI: no nausea, vomiting, abdominal pain, diarrhea, constipation GU: no dysuria, burning on urination, increased urinary frequency, hematuria  Ext: no leg edema Neuro: no unilateral weakness, numbness, or tingling, no vision change or hearing loss Skin: no rash, no skin tear. MSK: No muscle spasm, no deformity, no limitation of range of movement in spin Heme: No easy bruising.  Travel history: No recent long distant travel.   Allergy: Allergies[1]  Past Medical History:  Diagnosis Date   Celiac disease    Migraines    Osteoarthritis  Osteoporosis    Smoker     Past Surgical History:  Procedure Laterality Date   ABDOMINAL HYSTERECTOMY     TUBAL LIGATION      Social History:  reports that she has been smoking. She has never used smokeless tobacco. She reports current alcohol use. She reports that she does not use  drugs.  Family History:  Family History  Problem Relation Age of Onset   CAD Mother    Diabetes Father    Multiple myeloma Father    Hypertension Brother      Prior to Admission medications  Medication Sig Start Date End Date Taking? Authorizing Provider  oxyCODONE -acetaminophen  (PERCOCET/ROXICET) 5-325 MG tablet Take 2 tablets by mouth every 4 (four) hours as needed for severe pain. 11/13/17   Charlann Isaiah SQUIBB, MD    Physical Exam: Vitals:   10/01/24 0000 10/01/24 0030 10/01/24 0100 10/01/24 0128  BP: (!) 164/122 117/71 107/66 117/64  Pulse: 71 71 62 62  Resp:  18 18 18   Temp:    98.3 F (36.8 C)  TempSrc:      SpO2: 100% 98% 95% 97%  Weight:       General: Not in acute distress HEENT:       Eyes: PERRL, EOMI, no jaundice       ENT: No discharge from the ears and nose, no pharynx injection, no tonsillar enlargement.        Neck: No JVD, no bruit, no mass felt. Heme: No neck lymph node enlargement. Cardiac: S1/S2, RRR, No murmurs, No gallops or rubs. Respiratory: No rales, wheezing, rhonchi or rubs. GI: Soft, nondistended, nontender, no rebound pain, no organomegaly, BS present. GU: No hematuria Ext: No pitting leg edema bilaterally. 1+DP/PT pulse bilaterally. Musculoskeletal: No joint deformities, No joint redness or warmth, no limitation of ROM in spin. Skin: No rashes.  Neuro: Alert, oriented X3, cranial nerves II-XII grossly intact, moves all extremities normally. Muscle strength 5/5 in all extremities, sensation to light touch intact. Brachial reflex 2+ bilaterally. Knee reflex 1+ bilaterally. Negative Babinski's sign. Normal finger to nose test. Psych: Patient is not psychotic, no suicidal or hemocidal ideation.  Labs on Admission: I have personally reviewed following labs and imaging studies  CBC: No results for input(s): WBC, NEUTROABS, HGB, HCT, MCV, PLT in the last 168 hours. Basic Metabolic Panel: No results for input(s): NA, K, CL,  CO2, GLUCOSE, BUN, CREATININE, CALCIUM, MG, PHOS in the last 168 hours. GFR: CrCl cannot be calculated (Patient's most recent lab result is older than the maximum 21 days allowed.). Liver Function Tests: No results for input(s): AST, ALT, ALKPHOS, BILITOT, PROT, ALBUMIN in the last 168 hours. No results for input(s): LIPASE, AMYLASE in the last 168 hours. No results for input(s): AMMONIA in the last 168 hours. Coagulation Profile: No results for input(s): INR, PROTIME in the last 168 hours. Cardiac Enzymes: No results for input(s): CKTOTAL, CKMB, CKMBINDEX, TROPONINI in the last 168 hours. BNP (last 3 results) No results for input(s): PROBNP in the last 8760 hours. HbA1C: No results for input(s): HGBA1C in the last 72 hours. CBG: No results for input(s): GLUCAP in the last 168 hours. Lipid Profile: No results for input(s): CHOL, HDL, LDLCALC, TRIG, CHOLHDL, LDLDIRECT in the last 72 hours. Thyroid Function Tests: No results for input(s): TSH, T4TOTAL, FREET4, T3FREE, THYROIDAB in the last 72 hours. Anemia Panel: No results for input(s): VITAMINB12, FOLATE, FERRITIN, TIBC, IRON, RETICCTPCT in the last 72 hours. Urine analysis: No results found for: COLORURINE,  APPEARANCEUR, LABSPEC, PHURINE, GLUCOSEU, HGBUR, BILIRUBINUR, KETONESUR, PROTEINUR, UROBILINOGEN, NITRITE, LEUKOCYTESUR Sepsis Labs: @LABRCNTIP (procalcitonin:4,lacticidven:4) )No results found for this or any previous visit (from the past 240 hours).   Radiological Exams on Admission:   Assessment/Plan Principal Problem:   Closed right ankle fracture Active Problems:   Fall at home, initial encounter   Migraines   Celiac disease   Smoker   Bipolar disorder (HCC)   Assessment and Plan:  Closed right ankle fracture: as shown by X-ray and CT scan of right ankle above.  No neurovascular compromise. Dr. Lennie of  podiatry was consulted.   - will admit to Med-surg bed as inpt - Pain control: prn fentanyl , percocet and tyleno - When necessary Zofran  for nausea - Robaxin  for muscle spasm - Lidoderm  patch for pain - type and cross - INR/PTT - PT/OT when able to (not ordered now) - per EDP, Dr. Lennie recommended n.p.o. at 7 AM.  Fall at home, initial encounter -Fall precaution - PT/OT when able to  Migraines -As needed sumatriptan   Celiac disease: No nausea, vomiting, diarrhea or abdominal pain. - Patient needs to take gluten-free diet  Smoker: Patient states that she already cut down smoking -Nicotine  patch  Bipolar disorder (HCC) - Continue Lamictal  150 mg twice daily - Add as needed Ativan  0.5 mg 3 times daily  Abnormal findings of x-ray of right tibia/fibula: X-ray showed Ill-defined 1.8 x 1.5 x 2.2 cm intramedullary lesion with calcific matrix in the proximal tibial metaphysis. Etiology is not clear. Per radiologist, this is possibly a chondroid lesion or chronic bone infarct. -f/u MRI-right tibia/fibula with and without contrast.      DVT ppx: SCD  Code Status: Full code  Family Communication: No family at the bedside  Disposition Plan:  Anticipate discharge back to previous environment  Consults called:  Dr. Lennie of podiatry is consulted by EDP.  Admission status and Level of care: Med-Surg:  as inpt        Dispo: The patient is from: Home              Anticipated d/c is to: To be determined              Anticipated d/c date is: 2 days              Patient currently is not medically stable to d/c.    Severity of Illness:  The appropriate patient status for this patient is INPATIENT. Inpatient status is judged to be reasonable and necessary in order to provide the required intensity of service to ensure the patient's safety. The patient's presenting symptoms, physical exam findings, and initial radiographic and laboratory data in the context of their chronic  comorbidities is felt to place them at high risk for further clinical deterioration. Furthermore, it is not anticipated that the patient will be medically stable for discharge from the hospital within 2 midnights of admission.   * I certify that at the point of admission it is my clinical judgment that the patient will require inpatient hospital care spanning beyond 2 midnights from the point of admission due to high intensity of service, high risk for further deterioration and high frequency of surveillance required.*       Date of Service 10/01/2024    Caleb Exon [facility]   If 7PM-7AM, please contact night-coverage [email or web address] 10/01/2024, 1:33 AM       [1]  Allergies Allergen Reactions   Codeine Other (See Comments)   "

## 2024-10-01 NOTE — Op Note (Signed)
 PODIATRY / FOOT AND ANKLE SURGERY OPERATIVE REPORT    SURGEON: Prentice Lee, DPM  PRE-OPERATIVE DIAGNOSIS:  1.  Right bimalleolar ankle fracture closed, displaced 2.  Right ankle syndesmotic disruption  POST-OPERATIVE DIAGNOSIS: Same  PROCEDURE(S): Right bimalleolar ankle fracture open reduction with internal fixation with syndesmotic repair Use of intraoperative fluoroscopy  HEMOSTASIS: Right thigh tourniquet  ANESTHESIA: general  ESTIMATED BLOOD LOSS: 10 cc  FINDING(S): 1.  Slightly displaced fibular fracture which appeared to be oblique and more oriented from anterior to posterior 2.  Syndesmotic disruption 3.  Nondisplaced posterior malleolar fracture  PATHOLOGY/SPECIMEN(S): None  INDICATIONS:   Jasmine Hines is a 69 y.o. female who presents with an injury to the right ankle.  Patient fell when cleaning her house last night and slipped and landed on her right ankle.  Patient was unable to walk.  EMS brought patient to ED and x-rays were taken revealing a bimalleolar ankle fracture with some displacement and syndesmotic disruption.  Patient was seen in the hospital and treatment options were discussed.  All treatment options were discussed with the patient both conservative and surgical attempts at correction include potential risks and complications at this time patient is elected for surgery consisting of right ankle bimalleolar fracture open reduction with internal fixation with syndesmotic ligament repair.  No guarantees given.  Patient could need rehab placement postop.  Discussed risk of posttraumatic arthritis and other complications that can occur.  Patient agreeable.  No guarantees given.  DESCRIPTION: After obtaining full informed written consent, the patient was brought back to the operating room and placed supine upon the operating table.  The patient received IV antibiotics prior to induction.  After obtaining adequate anesthesia, the patient was prepped and draped  in the standard fashion.  A popliteal and saphenous nerve block was performed by anesthesia preoperatively.  An Esmarch bandage was used to exsanguinate the right lower extremity and pneumatic thigh tourniquet was inflated.  Attention was directed to the right lateral ankle and fluoroscopic guidance was used to mark out the incision over the distal fibula and the area the fracture.  Preop imaging was then taken both AP and lateral views of the ankle showing the displaced distal fibular fracture with syndesmotic disruption.  Medial clear space gapping was noted.  The incision was made and deepened to the subcutaneous tissues utilizing sharp and blunt dissection and care was taken to identify and retract all vital neurovascular structures and all venous contributories were cauterized necessary.  At this time a periosteal incision was made into the distal fibula and lateral malleolus and the periosteum was dissected free off of the bone thereby exposing the fracture at the operative site.  The fracture appeared to be oblique in nature more from anterior to posterior.  The fracture was then mobilized.  The fracture appeared to be easily able to reduced.  The fracture was reduced and held in place with a reduction clamp.  Fluoroscopic guidance was used to verify correction which appeared to be excellent.  This appeared to reduce the posterior malleolar fracture as well.  The syndesmosis appeared to be slightly gapped still with slight increase in medial clear space indicative of the syndesmotic injury and rupture.  The fibula appeared to be out to length.  At this time it was attempted to place a crossing screw across the fracture site but the orientation was very difficult so the screw was abandoned and the reduction clamp was held intact and appeared to hold good compression across the  fracture site.  An 11 hole Paragon 28 distal fibular locking plate was placed under fluoroscopic guidance and held with temporary  olive wire fixation at the ends of the plate.  The plate appeared to be in good position under fluoroscopic guidance.  4 screws were placed proximal to the fracture site utilizing standard AO principles and techniques that were all 2.7 mm locking screws.  4 locking screws then also placed distal to the fracture site into the lateral malleolus and distal fibular shaft that were all 2.7 mm locking in of the appropriate size and length.  Fluoroscopic guidance was used to place all the screws.  The fracture appeared to be well reduced and appeared to have good compression clinically and radiographically.  The fibula appeared to be in anatomic position.  At this time stressing was then performed with a cotton hook test and appeared to be gapping further at the syndesmotic area as well as widening of the medial clear space.  At this time it was determined to place syndesmotic fixation.  One of the screw holes that was distal to the fracture was then used with the reflex Paragon syndesmotic fixation instrumentation.  This was placed under fluoroscopic guidance utilizing standard manufactures technique.  While doing this a reduction clamp was used and placed on the fibula and the tibia to hold the syndesmotic reduction.  Once this was intact and the anchor was placed for fixation the syndesmotic reduction appeared to be excellent in near anatomic position.  The reduction clamp was removed and fixation appeared to be intact.  Final C-arm imaging was then taken showing anatomic reduction of the ankle with plate and screw fixation.  Surgical site was flushed with copious amounts normal sterile saline.  The periosteum was then reapproximated well coapted with 3-0 Vicryl.  Subcutaneous tissues reapproximated well coapted with 3-0 Vicryl.  The skin was then reapproximated well coapted with skin staples.  A postoperative dressing was applied consisting of Xeroform, 4 x 4 gauze, ABD pads, gauze roll, posterior and sugar-tong  splint, Ace wrap's.  The pneumatic ankle tourniquet was deflated and a prompt hyperemic response was noted all digits of the right foot.  The patient tolerated the procedure and anesthesia well and was transferred to the recovery room vital signs stable and vascular status intact to all toes of the right foot.  Following a period of postoperative monitoring the patient be discharged back to the inpatient room with the appropriate orders, instructions, medications.  Patient is to remain nonweightbearing at all times the right lower extremity.  PT/OT has been ordered to start tomorrow.  May need skilled nursing rehab placement.  Podiatry team to likely sign off tomorrow.  COMPLICATIONS: None  CONDITION: Good, stable  Prentice Lee, DPM

## 2024-10-01 NOTE — ED Notes (Signed)
 Pt assisted onto bedpan.

## 2024-10-01 NOTE — Progress Notes (Signed)
"  Report given to same day surgery RN  "

## 2024-10-01 NOTE — Anesthesia Procedure Notes (Signed)
 Procedure Name: LMA Insertion Date/Time: 10/01/2024 4:32 PM  Performed by: Landy Francena BIRCH, CRNAPre-anesthesia Checklist: Patient identified, Emergency Drugs available, Suction available and Patient being monitored Patient Re-evaluated:Patient Re-evaluated prior to induction Oxygen Delivery Method: Circle system utilized Preoxygenation: Pre-oxygenation with 100% oxygen Induction Type: IV induction LMA: LMA inserted LMA Size: 4.0 Number of attempts: 1 Placement Confirmation: positive ETCO2 and breath sounds checked- equal and bilateral Tube secured with: Tape Dental Injury: Teeth and Oropharynx as per pre-operative assessment

## 2024-10-01 NOTE — Anesthesia Procedure Notes (Signed)
 Anesthesia Regional Block: Popliteal block   Pre-Anesthetic Checklist: , timeout performed,  Correct Patient, Correct Site, Correct Laterality,  Correct Procedure, Correct Position, site marked,  Risks and benefits discussed,  Surgical consent,  Pre-op evaluation,  At surgeon's request and post-op pain management  Laterality: Right and Lower  Prep: chloraprep       Needles:  Injection technique: Single-shot  Needle Type: Stimiplex     Needle Length: 9cm  Needle Gauge: 22     Additional Needles:   Procedures:,,,, ultrasound used (permanent image in chart),,    Narrative:  Start time: 10/01/2024 4:08 PM End time: 10/01/2024 4:10 PM Injection made incrementally with aspirations every 5 mL.  Performed by: Personally  Anesthesiologist: Vicci Camellia Glatter, MD  Additional Notes: Patient consented for risk and benefits of nerve block including but not limited to nerve damage, failed block, bleeding and infection.  Patient voiced understanding.  Functioning IV was confirmed and monitors were applied.  Timeout done prior to procedure and prior to any sedation being given to the patient.  Patient confirmed procedure site prior to any sedation given to the patient. Sterile prep,hand hygiene and sterile gloves were used.  Minimal sedation used for procedure.  No paresthesia endorsed by patient during the procedure.  Negative aspiration and negative test dose prior to incremental administration of local anesthetic. The patient tolerated the procedure well with no immediate complications.

## 2024-10-02 ENCOUNTER — Encounter: Payer: Self-pay | Admitting: Podiatry

## 2024-10-02 DIAGNOSIS — S82891S Other fracture of right lower leg, sequela: Secondary | ICD-10-CM | POA: Diagnosis not present

## 2024-10-02 DIAGNOSIS — S82891A Other fracture of right lower leg, initial encounter for closed fracture: Secondary | ICD-10-CM | POA: Diagnosis not present

## 2024-10-02 LAB — CBC
HCT: 37.2 % (ref 36.0–46.0)
Hemoglobin: 12.1 g/dL (ref 12.0–15.0)
MCH: 28.8 pg (ref 26.0–34.0)
MCHC: 32.5 g/dL (ref 30.0–36.0)
MCV: 88.6 fL (ref 80.0–100.0)
Platelets: 494 K/uL — ABNORMAL HIGH (ref 150–400)
RBC: 4.2 MIL/uL (ref 3.87–5.11)
RDW: 13.6 % (ref 11.5–15.5)
WBC: 12.2 K/uL — ABNORMAL HIGH (ref 4.0–10.5)
nRBC: 0 % (ref 0.0–0.2)

## 2024-10-02 LAB — BASIC METABOLIC PANEL WITH GFR
Anion gap: 10 (ref 5–15)
BUN: 12 mg/dL (ref 8–23)
CO2: 23 mmol/L (ref 22–32)
Calcium: 8.9 mg/dL (ref 8.9–10.3)
Chloride: 103 mmol/L (ref 98–111)
Creatinine, Ser: 0.75 mg/dL (ref 0.44–1.00)
GFR, Estimated: >60 mL/min
Glucose, Bld: 137 mg/dL — ABNORMAL HIGH (ref 70–99)
Potassium: 4.6 mmol/L (ref 3.5–5.1)
Sodium: 136 mmol/L (ref 135–145)

## 2024-10-02 MED ORDER — ENOXAPARIN SODIUM 40 MG/0.4ML IJ SOSY
40.0000 mg | PREFILLED_SYRINGE | INTRAMUSCULAR | Status: DC
  Administered 2024-10-02: 40 mg via SUBCUTANEOUS
  Filled 2024-10-02: qty 0.4

## 2024-10-02 NOTE — NC FL2 (Addendum)
 " [place]  MEDICAID FL2 LEVEL OF CARE FORM     IDENTIFICATION  Patient Name: Jasmine Hines Birthdate: 12/25/1955 Sex: female Admission Date (Current Location): 09/30/2024  [facility] and [place] Number:  Chiropodist and Address:  [facility], [address] [place], [place] [address]      Provider Number: [id number]  Attending Physician Name and Address:  Jerelene Critchley, MD  Relative Name and Phone Number:       Current Level of Care: Hospital Recommended Level of Care: Skilled Nursing Facility Prior Approval Number:    Date Approved/Denied:   PASRR Number:   [id number]  Discharge Plan: SNF    Current Diagnoses: Patient Active Problem List   Diagnosis Date Noted   Closed right ankle fracture 10/01/2024   Fall at home, initial encounter 10/01/2024   Smoker    Bipolar disorder (HCC)    Migraines    Celiac disease     Orientation RESPIRATION BLADDER Height & Weight     Self, Time, Situation, Place  Normal Continent Weight: 162 lb 7.7 oz (73.7 kg) Height:  5' 2 (157.5 cm)  BEHAVIORAL SYMPTOMS/MOOD NEUROLOGICAL BOWEL NUTRITION STATUS      Continent Diet (Gluten Free)  AMBULATORY STATUS COMMUNICATION OF NEEDS Skin   Limited Assist Verbally Surgical wounds (Surgical closed incision, Right Ankle)                       Personal Care Assistance Level of Assistance  Bathing, Dressing, Feeding Bathing Assistance: Limited assistance Feeding assistance: Independent Dressing Assistance: Limited assistance     Functional Limitations Info  Sight, Hearing, Speech Sight Info: Adequate Hearing Info: Adequate Speech Info: Adequate    SPECIAL CARE FACTORS FREQUENCY  PT (By licensed PT), OT (By licensed OT)     PT Frequency: 5x/week OT Frequency: 5x/week            Contractures      Additional Factors Info  Code Status, Allergies Code Status Info: Full Allergies Info: Codeine           Current Medications  (10/02/2024):  This is the current hospital active medication list Current Facility-Administered Medications  Medication Dose Route Frequency Provider Last Rate Last Admin   acetaminophen  (TYLENOL ) tablet 650 mg  650 mg Oral Q6H PRN Lennie Barter, DPM       fentaNYL  (SUBLIMAZE ) injection 25 mcg  25 mcg Intravenous Q3H PRN Lennie Barter, DPM   25 mcg at 10/01/24 1445   lactated ringers  infusion   Intravenous Continuous Lennie Barter, DPM 75 mL/hr at 10/01/24 2040 New Bag at 10/01/24 2040   lamoTRIgine  (LAMICTAL ) tablet 150 mg  150 mg Oral BID Lennie Barter, DPM   150 mg at 10/02/24 9170   lidocaine  (LIDODERM ) 5 % 1 patch  1 patch Transdermal QHS Lennie Barter, DPM   1 patch at 10/01/24 0150   LORazepam  (ATIVAN ) tablet 0.5 mg  0.5 mg Oral Q8H PRN Lennie Barter, DPM   0.5 mg at 10/02/24 0136   methocarbamol  (ROBAXIN ) tablet 500 mg  500 mg Oral Q8H PRN Lennie Barter, DPM   500 mg at 10/02/24 9862   nicotine  (NICODERM CQ  - dosed in mg/24 hours) patch 21 mg  21 mg Transdermal Daily Lennie Barter, DPM   21 mg at 10/02/24 0831   ondansetron  (ZOFRAN ) injection 4 mg  4 mg Intravenous Q8H PRN Lennie Barter, DPM       oxyCODONE -acetaminophen  (PERCOCET/ROXICET) 5-325 MG per tablet 1 tablet  1 tablet Oral Q4H PRN Lennie Barter, DPM   1 tablet at 10/01/24 0150   senna-docusate (Senokot-S) tablet 1 tablet  1 tablet Oral QHS PRN Lennie Barter, DPM       SUMAtriptan  (IMITREX ) tablet 50 mg  50 mg Oral Q2H PRN Lennie Barter, DPM         Discharge Medications: Please see discharge summary for a list of discharge medications.  Relevant Imaging Results:  Relevant Lab Results:   Additional Information SSN: [id number]  [name]  Silvana, LCSW     "

## 2024-10-02 NOTE — Evaluation (Signed)
 Physical Therapy Evaluation Patient Details Name: Jasmine Hines MRN: [id number] DOB: Nov 01, 1955 Today's Date: 10/02/2024  History of Present Illness  Jasmine Hines is a 69 y.o. female with medical history significant of bipolar disorder, PTSD, smoker, celiac disease, migraine headache, osteoporosis, who presents with fall and right ankle pain, imaging revealed R ankle fx, underwent ORIF to RLE with Dr. Lennie on 10/01/24. She is NWB.  Clinical Impression  Pt pleasant and loquacious t/o session.  She was able to do relatively well with bed mobility but needed a lot of cuing and reinforcement for safe transition to standing, she was very quick to fatigue with limited ambulation/hopping with walker and though she did not have any overt LOBs and did maintain WBing she did not display a lot of confidence.  Pt will benefit from continued PT to address functional limitations.        If plan is discharge home, recommend the following: A little help with walking and/or transfers;A little help with bathing/dressing/bathroom;Assistance with cooking/housework;Assist for transportation;Help with stairs or ramp for entrance   Can travel by private vehicle   Yes    Equipment Recommendations Other (comment) (TBD at next venue, if home likely RW, W/C (or knee scooter), 3-in-1)  Recommendations for Other Services       Functional Status Assessment Patient has had a recent decline in their functional status and demonstrates the ability to make significant improvements in function in a reasonable and predictable amount of time.     Precautions / Restrictions Precautions Precautions: Fall Recall of Precautions/Restrictions: Impaired Restrictions RLE Weight Bearing Per Provider Order: Non weight bearing      Mobility  Bed Mobility Overal bed mobility: Modified Independent             General bed mobility comments: Pt was able to lift R LE and get to EOB, no direct assist needed     Transfers Overall transfer level: Needs assistance Equipment used: Rolling walker (2 wheels) Transfers: Sit to/from Stand Sit to Stand: Min assist           General transfer comment: consistent reminder for NWBing precautions, positioning, sequencing and hand placement and walker management    Ambulation/Gait Ambulation/Gait assistance: Min assist Gait Distance (Feet): 20 Feet Assistive device: Rolling walker (2 wheels)         General Gait Details: Pt initially struggled to use UEs effectively to unweight and hop but with excessive cuing and encouagement did show improvement.  Pt quick to fatigue with the effort.  Stairs            Wheelchair Mobility     Tilt Bed    Modified Rankin (Stroke Patients Only)       Balance Overall balance assessment: Needs assistance Sitting-balance support: Feet supported, No upper extremity supported Sitting balance-Leahy Scale: Normal     Standing balance support: Bilateral upper extremity supported, Reliant on assistive device for balance Standing balance-Leahy Scale: Poor                               Pertinent Vitals/Pain Pain Assessment Pain Score: 5  Pain Location: R ankle    Home Living Family/patient expects to be discharged to:: Private residence Living Arrangements: Alone Available Help at Discharge: Family;Friend(s);Available PRN/intermittently Type of Home: House Home Access: Ramped entrance       Home Layout: One level Home Equipment: Cane - single point Additional Comments: 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    Prior Function Prior Level of Function : Independent/Modified Independent             Mobility Comments: independent ADLs Comments: independent     Extremity/Trunk Assessment   Upper Extremity Assessment Upper Extremity Assessment: Generalized weakness    Lower Extremity Assessment Lower Extremity Assessment: Generalized  weakness (WFL, R distal LE splint, AROM in hip and knee)       Communication   Communication Communication: No apparent difficulties    Cognition Arousal: Alert Behavior During Therapy: Anxious   PT - Cognitive impairments: No apparent impairments                         Following commands: Impaired Following commands impaired: Follows one step commands with increased time     Cueing Cueing Techniques: Verbal cues, Tactile cues     General Comments General comments (skin integrity, edema, etc.): bandage to RLE C/D/I    Exercises     Assessment/Plan    PT Assessment Patient needs continued PT services  PT Problem List Decreased strength;Decreased range of motion;Decreased activity tolerance;Decreased balance;Decreased mobility;Decreased knowledge of use of DME;Decreased safety awareness;Pain       PT Treatment Interventions DME instruction;Gait training;Functional mobility training;Therapeutic activities;Therapeutic exercise;Balance training;Neuromuscular re-education;Cognitive remediation;Patient/family education;Wheelchair mobility training    PT Goals (Current goals can be found in the Care Plan section)  Acute Rehab PT Goals Patient Stated Goal: Pt hopes to go home, but endorses having limited available help PT Goal Formulation: With patient Time For Goal Achievement: 10/15/24 Potential to Achieve Goals: Fair    Frequency 7X/week     Co-evaluation               AM-PAC PT 6 Clicks Mobility  Outcome Measure Help needed turning from your back to your side while in a flat bed without using bedrails?: None Help needed moving from lying on your back to sitting on the side of a flat bed without using bedrails?: None Help needed moving to and from a bed to a chair (including a wheelchair)?: A Little Help needed standing up from a chair using your arms (e.g., wheelchair or bedside chair)?: A Little Help needed to walk in hospital room?: A Lot Help  needed climbing 3-5 steps with a railing? : Total 6 Click Score: 17    End of Session Equipment Utilized During Treatment: Gait belt Activity Tolerance: Patient tolerated treatment well;Patient limited by fatigue Patient left: with chair alarm set;with call bell/phone within reach Nurse Communication: Mobility status PT Visit Diagnosis: Muscle weakness (generalized) (M62.81);Difficulty in walking, not elsewhere classified (R26.2);Unsteadiness on feet (R26.81);Pain;Other abnormalities of gait and mobility (R26.89) Pain - Right/Left: Right Pain - part of body: Ankle and joints of foot    Time: 9159-9078 PT Time Calculation (min) (ACUTE ONLY): 41 min   Charges:   PT Evaluation $PT Eval Low Complexity: 1 Low PT Treatments $Gait Training: 8-22 mins PT General Charges $$ ACUTE PT VISIT: 1 Visit         Carmin JONELLE Deed, DPT 10/02/2024, 11:51 AM

## 2024-10-02 NOTE — Progress Notes (Signed)
 Pt reports no sensation in her R toes and cannot move them. Pulses are +3, cap refill <3 sec. Per surgery, this is WNL for the nerve block she received. Will continue to monitor.

## 2024-10-02 NOTE — Progress Notes (Signed)
 Daily Progress Note   Subjective  - 1 Day Post-Op  Patient is status post ORIF right ankle fracture.  She states she has been resting comfortably.  She has worked with physical therapy.  She has been using a walker.  She did relate having a fall in the bathroom while using a walker.  She states this was witnessed by the nurse who assisted her at that time.  She feels little bit of sensation coming back to the toes today.  Objective Vitals:   10/02/24 0004 10/02/24 0602 10/02/24 0744 10/02/24 1521  BP: 103/60 104/67 110/65 135/67  Pulse: 96 91 73 84  Resp: 15 17 18 16   Temp: 98.2 F (36.8 C) 97.7 F (36.5 C) 98.1 F (36.7 C) 98.1 F (36.7 C)  TempSrc: Oral Oral Oral Oral  SpO2: 94% 96% 96% 98%  Weight:      Height:        Physical Exam: Splint intact.  Sensation is starting to return to the digits.  Good capillary fill time.  No pain to the thigh.  Laboratory CBC    Component Value Date/Time   WBC 12.2 (H) 10/02/2024 0519   HGB 12.1 10/02/2024 0519   HCT 37.2 10/02/2024 0519   PLT 494 (H) 10/02/2024 0519    BMET    Component Value Date/Time   NA 136 10/02/2024 0519   K 4.6 10/02/2024 0519   CL 103 10/02/2024 0519   CO2 23 10/02/2024 0519   GLUCOSE 137 (H) 10/02/2024 0519   BUN 12 10/02/2024 0519   CREATININE 0.75 10/02/2024 0519   CALCIUM 8.9 10/02/2024 0519   GFRNONAA >60 10/02/2024 0519   GFRAA >60 11/12/2017 2224    Assessment/Planning: Right ankle fracture status post ORIF day 1  I discussed nonweightbearing to the right lower extremity she feels comfortable to get around with this. Keep the splint clean dry and intact.  No bandage changes needed at this time. Elevate whenever nonambulatory. Patient should follow-up in outpatient clinic in 1 week for reevaluation with Dr. Lennie. From podiatry standpoint stable for discharge.  No dressing changes needed.  Remain nonweightbearing.  Leg elevated when nonambulatory.   Ashley Soulier A  10/02/2024, 4:50  PM

## 2024-10-02 NOTE — Plan of Care (Signed)
" °  Problem: Education: Goal: Knowledge of General Education information will improve Description: Including pain rating scale, medication(s)/side effects and non-pharmacologic comfort measures Outcome: Progressing   Problem: Health Behavior/Discharge Planning: Goal: Ability to manage health-related needs will improve Outcome: Progressing   Problem: Clinical Measurements: Goal: Will remain free from infection Outcome: Progressing Goal: Diagnostic test results will improve Outcome: Progressing   Problem: Coping: Goal: Level of anxiety will decrease Outcome: Progressing   Problem: Elimination: Goal: Will not experience complications related to bowel motility Outcome: Progressing   Problem: Pain Managment: Goal: General experience of comfort will improve and/or be controlled Outcome: Progressing   Problem: Safety: Goal: Ability to remain free from injury will improve Outcome: Progressing   Problem: Skin Integrity: Goal: Risk for impaired skin integrity will decrease Outcome: Progressing   "

## 2024-10-02 NOTE — Anesthesia Postprocedure Evaluation (Signed)
"   Anesthesia Post Note  Patient: Jasmine Hines  Procedure(s) Performed: OPEN REDUCTION INTERNAL FIXATION (ORIF) ANKLE FRACTURE (Right: Ankle)  Patient location during evaluation: PACU Anesthesia Type: General Level of consciousness: awake and alert Pain management: pain level controlled Vital Signs Assessment: post-procedure vital signs reviewed and stable Respiratory status: spontaneous breathing, nonlabored ventilation, respiratory function stable and patient connected to nasal cannula oxygen Cardiovascular status: blood pressure returned to baseline and stable Postop Assessment: no apparent nausea or vomiting Anesthetic complications: no   No notable events documented.   Last Vitals:  Vitals:   10/01/24 2043 10/02/24 0004  BP: 107/70 103/60  Pulse: 99 96  Resp: 17 15  Temp: 36.4 C 36.8 C  SpO2: 94% 94%    Last Pain:  Vitals:   10/02/24 0144  TempSrc:   PainSc: 4                  Lynwood KANDICE Clause      "

## 2024-10-02 NOTE — Progress Notes (Signed)
 "  10/02/24 1518  What Happened  Was fall witnessed? Yes  Who witnessed fall? Noah RN  Patients activity before fall bathroom-assisted  Point of contact buttocks;hip/leg  Was patient injured? No  Provider Notification  Provider Name/Title Laree Lock  Date Provider Notified 10/02/24  Time Provider Notified 1541  Method of Notification Page  Notification Reason Fall  Provider response No new orders  Date of Provider Response 10/02/24  Time of Provider Response 1548  Follow Up  Family notified Yes - comment  Time family notified 1536  Additional tests No  Simple treatment Ice  Progress note created (see row info) Yes  Adult Fall Risk Assessment  Risk Factor Category (scoring not indicated) Not Applicable  Age 69  Fall History: Fall within 6 months prior to admission 5  Elimination; Bowel and/or Urine Incontinence 0  Elimination; Bowel and/or Urine Urgency/Frequency 0  Medications: includes PCA/Opiates, Anti-convulsants, Anti-hypertensives, Diuretics, Hypnotics, Laxatives, Sedatives, and Psychotropics 3  Patient Care Equipment 0  Mobility-Assistance 2  Mobility-Gait 2  Mobility-Sensory Deficit 0  Altered awareness of immediate physical environment 0  Impulsiveness 0  Lack of understanding of one's physical/cognitive limitations 0  Total Score 13  Patient Fall Risk Level High fall risk  Adult Fall Risk Interventions  Required Bundle Interventions *See Row Information* High fall risk  Required Bundle Interventions *See Row Information* High fall risk  Additional Interventions Use of appropriate toileting equipment (bedpan, BSC, etc.)  Fall intervention(s) refused/Patient educated regarding refusal Nonskid socks (pt wears her shoe when OOB)  Screening for Fall Injury Risk (To be completed on HIGH fall risk patients) - Assessing Need for Floor Mats  Risk For Fall Injury- Criteria for Floor Mats Admitted as a result of a fall;Previous fall this admission  Will Implement Floor  Mats Yes  Pain Assessment  Pain Scale 0-10  Pain Score 0  Hot/Cold Interventions  Hot/Cold Interventions Ice Pack  PCA/Epidural/Spinal Assessment  Respiratory Pattern Regular;Unlabored  Neurological  Neuro (WDL) X  Level of Consciousness Alert  Orientation Level Oriented X4  Cognition Appropriate at baseline  Speech Clear  R Pupil Size (mm) 3  R Pupil Shape Round  R Pupil Reaction Brisk  L Pupil Size (mm) 3  L Pupil Shape Round  L Pupil Reaction Brisk  Motor Function/Sensation Assessment Sensation;Motor response;Dorsiflexion;Plantar flexion  R Foot Dorsiflexion Weak  L Foot Dorsiflexion Moderate  R Foot Plantar Flexion Weak  L Foot Plantar Flexion Moderate  RUE Motor Response Purposeful movement  RUE Sensation Full sensation  LUE Motor Response Purposeful movement  LUE Sensation Full sensation  RLE Motor Response No movement due to regional block  RLE Sensation Decreased  LLE Motor Response Purposeful movement  LLE Sensation Full sensation  Neuro Symptoms None  Glasgow Coma Scale  Eye Opening 4  Best Verbal Response (NON-intubated) 5  Best Motor Response 6  Glasgow Coma Scale Score 15  Musculoskeletal  Musculoskeletal (WDL) X  Assistive Device Front wheel walker  Generalized Weakness Yes  Weight Bearing Restrictions Per Provider Order Yes  RLE Weight Bearing Per Provider Order NWB  Integumentary  Integumentary (WDL) X  Skin Color Appropriate for ethnicity  Skin Condition Dry  Skin Integrity Other (Comment);Intact (surgical dressing)  Skin Turgor Non-tenting   Pt had a witnessed fall where she was ambulating to the bathroom with a walker, NWB on her R leg, and she mistepped and lost her balance. She fell into the witnessing RN, who caught her and lowered her to the floor. Additional staff  were called. VS WNL. MD Ponnala aware, no new orders. Will continue to monitor. "

## 2024-10-02 NOTE — Progress Notes (Addendum)
 R hand IV infiltrated w/ LR; IV removed, warm pack applied, and extremity elevated. MD Shruthi and pharmacy aware. Will continue to monitor.

## 2024-10-02 NOTE — Discharge Instructions (Addendum)
 [facility]  POST OPERATIVE INSTRUCTIONS FOR DR. ASHLEY AND DR. Verity [facility] PODIATRY DEPARTMENT   Take your medication as prescribed.  Pain medication should be taken only as needed.  Keep the dressing clean, dry and intact.  Remain nonweightbearing at all times to the right foot/ankle.  Keep your foot elevated above the heart level   We have instructed you to be non-weight bearing.  Always wear your post-op shoe when walking.  Always use your crutches if you are to be non-weight bearing.  Do not take a shower. Baths are permissible as long as the foot is kept out of the water.   Every hour you are awake:  Bend your knee 15 times.   Call [facility] [phone]) if any of the following problems occur: You develop a temperature or fever. The bandage becomes saturated with blood. Medication does not stop your pain. Injury of the foot occurs. Any symptoms of infection including redness, odor, or red streaks running from wound.

## 2024-10-02 NOTE — Evaluation (Signed)
 Occupational Therapy Evaluation Patient Details Name: Jasmine Hines MRN: [id number] DOB: March 10, 1956 Today's Date: 10/02/2024   History of Present Illness   Jasmine Hines is a 69 y.o. female with medical history significant of bipolar disorder, PTSD, smoker, celiac disease, migraine headache, osteoporosis, who presents with fall and right ankle pain, imaging revealed R ankle fx, underwent ORIF to RLE with Dr. Lennie on 10/01/24. She is NWB.     Clinical Impressions Patient was seen for OT evaluation this date. Prior to hospital admission, patient was active and independent. Patient lives alone in a single story home with minimal assist/support. Patient tangential throughout eval, requires max cues to maintain attention to task and perseverates easily on upcoming plans and how this injury will impact them, she is difficult to redirect. She performed sit<>stands with min A/CGA with cues for hand placement and reminders of NWB to RLE with good return demo. Able to ambulate ~5 feet with r/w to perform toileting on BSC, able to manage toileting while seated on BSC and leaning. Patient presents with deficits in standing tolerance/balance while NWB to RLE, affecting safe and optimal ADL completion. Patient is currently requiring max A for LB dressing tasks.  Paient would benefit from skilled OT services to address noted impairments and functional limitations (see below for any additional details) in order to maximize safety and independence while minimizing future risk of falls, injury, and readmission. Anticipate the need for follow up OT services upon acute hospital DC.      If plan is discharge home, recommend the following:   A little help with walking and/or transfers;A little help with bathing/dressing/bathroom;Assist for transportation;Help with stairs or ramp for entrance;Assistance with cooking/housework     Functional Status Assessment   Patient has had a recent decline in their functional  status and demonstrates the ability to make significant improvements in function in a reasonable and predictable amount of time.     Equipment Recommendations   None recommended by OT     Recommendations for Other Services         Precautions/Restrictions   Precautions Precautions: Fall Recall of Precautions/Restrictions: Impaired Restrictions Weight Bearing Restrictions Per Provider Order: Yes RLE Weight Bearing Per Provider Order: Non weight bearing     Mobility Bed Mobility               General bed mobility comments: not tested, OOB pre/post tx    Transfers Overall transfer level: Needs assistance Equipment used: Rolling walker (2 wheels) Transfers: Sit to/from Stand Sit to Stand: Min assist           General transfer comment: cues for hand placement and walker management      Balance Overall balance assessment: Needs assistance Sitting-balance support: Feet supported, No upper extremity supported Sitting balance-Leahy Scale: Normal     Standing balance support: During functional activity, Reliant on assistive device for balance Standing balance-Leahy Scale: Poor                             ADL either performed or assessed with clinical judgement   ADL Overall ADL's : Needs assistance/impaired                         Toilet Transfer: Minimal assistance;BSC/3in1;Rolling walker (2 wheels)   Toileting- Clothing Manipulation and Hygiene: Contact guard assist;Sitting/lateral lean         General ADL Comments: anticipate max A  for LB self care tasks, set up A for UB self care tasks     Vision         Perception         Praxis         Pertinent Vitals/Pain Pain Assessment Pain Assessment: 0-10 Pain Score: 3  Pain Location: R ankle Pain Descriptors / Indicators: Aching Pain Intervention(s): Monitored during session     Extremity/Trunk Assessment Upper Extremity Assessment Upper Extremity Assessment:  Generalized weakness   Lower Extremity Assessment Lower Extremity Assessment: Defer to PT evaluation       Communication Communication Communication: No apparent difficulties   Cognition Arousal: Alert Behavior During Therapy: Anxious Cognition: No apparent impairments                               Following commands: Impaired Following commands impaired: Follows one step commands with increased time     Cueing  General Comments   Cueing Techniques: Verbal cues;Tactile cues  bandage to RLE C/D/I   Exercises     Shoulder Instructions      Home Living Family/patient expects to be discharged to:: Private residence Living Arrangements: Alone Available Help at Discharge: Family;Friend(s);Available PRN/intermittently Type of Home: House Home Access: Ramped entrance     Home Layout: One level     Bathroom Shower/Tub: Chief Strategy Officer: Standard     Home Equipment: Cane - single point   Additional Comments: reports she has a lot of stuff indicting unsafe environment/hoarder      Prior Functioning/Environment Prior Level of Function : Independent/Modified Independent             Mobility Comments: independent ADLs Comments: independent    OT Problem List: Decreased strength;Decreased activity tolerance;Impaired balance (sitting and/or standing);Decreased safety awareness   OT Treatment/Interventions: Self-care/ADL training;Energy conservation;DME and/or AE instruction;Therapeutic exercise;Therapeutic activities;Balance training      OT Goals(Current goals can be found in the care plan section)   Acute Rehab OT Goals Patient Stated Goal: to go home OT Goal Formulation: With patient Time For Goal Achievement: 10/16/24 Potential to Achieve Goals: Good ADL Goals Pt Will Perform Grooming: with modified independence;standing Pt Will Perform Lower Body Dressing: with set-up;sitting/lateral leans;sit to/from stand Pt Will  Transfer to Toilet: ambulating;bedside commode;with modified independence Pt Will Perform Toileting - Clothing Manipulation and hygiene: with modified independence;sitting/lateral leans;sit to/from stand   OT Frequency:  Min 2X/week    Co-evaluation              AM-PAC OT 6 Clicks Daily Activity     Outcome Measure Help from another person eating meals?: None Help from another person taking care of personal grooming?: A Little Help from another person toileting, which includes using toliet, bedpan, or urinal?: A Little Help from another person bathing (including washing, rinsing, drying)?: A Little Help from another person to put on and taking off regular upper body clothing?: A Little Help from another person to put on and taking off regular lower body clothing?: A Lot 6 Click Score: 18   End of Session Equipment Utilized During Treatment: Gait belt;Rolling walker (2 wheels) Nurse Communication: Mobility status  Activity Tolerance: Patient tolerated treatment well Patient left: in chair;with call bell/phone within reach;with chair alarm set  OT Visit Diagnosis: Unsteadiness on feet (R26.81);Repeated falls (R29.6);Other abnormalities of gait and mobility (R26.89);Muscle weakness (generalized) (M62.81);History of falling (Z91.81)  Time: 9048-8981 OT Time Calculation (min): 27 min Charges:  OT General Charges $OT Visit: 1 Visit OT Evaluation $OT Eval Low Complexity: 1 Low OT Treatments $Self Care/Home Management : 8-22 mins  Rogers Clause, OT/L MSOT, 10/02/2024

## 2024-10-03 DIAGNOSIS — S82891S Other fracture of right lower leg, sequela: Secondary | ICD-10-CM | POA: Diagnosis not present

## 2024-10-03 LAB — CBC
HCT: 35.3 % — ABNORMAL LOW (ref 36.0–46.0)
Hemoglobin: 11.5 g/dL — ABNORMAL LOW (ref 12.0–15.0)
MCH: 29.4 pg (ref 26.0–34.0)
MCHC: 32.6 g/dL (ref 30.0–36.0)
MCV: 90.3 fL (ref 80.0–100.0)
Platelets: 472 K/uL — ABNORMAL HIGH (ref 150–400)
RBC: 3.91 MIL/uL (ref 3.87–5.11)
RDW: 14.2 % (ref 11.5–15.5)
WBC: 12.8 K/uL — ABNORMAL HIGH (ref 4.0–10.5)
nRBC: 0 % (ref 0.0–0.2)

## 2024-10-03 MED ORDER — METHOCARBAMOL 500 MG PO TABS: 500.0000 mg | ORAL_TABLET | Freq: Three times a day (TID) | ORAL | Status: AC | PRN

## 2024-10-03 MED ORDER — OXYCODONE-ACETAMINOPHEN 5-325 MG PO TABS: 1.0000 | ORAL_TABLET | Freq: Four times a day (QID) | ORAL | 0 refills | Status: AC | PRN

## 2024-10-03 MED ORDER — LAMOTRIGINE 150 MG PO TABS: 150.0000 mg | ORAL_TABLET | Freq: Two times a day (BID) | ORAL | Status: AC

## 2024-10-03 MED ORDER — NICOTINE 21 MG/24HR TD PT24: 21.0000 mg | MEDICATED_PATCH | Freq: Every day | TRANSDERMAL | Status: AC

## 2024-10-03 MED ORDER — LORAZEPAM 0.5 MG PO TABS: 0.5000 mg | ORAL_TABLET | Freq: Three times a day (TID) | ORAL | 0 refills | Status: AC | PRN

## 2024-10-03 NOTE — Progress Notes (Signed)
 All requirements for discharge met.

## 2024-10-03 NOTE — Discharge Summary (Signed)
 " Physician Discharge Summary   Patient: Jasmine Hines MRN: [id number] DOB: Mar 05, 1956  Admit date:     09/30/2024  Discharge date: 10/03/2024  Discharge Physician: Laree Lock   PCP: Guiteau-Laurent, Angelique D, NP   Recommendations at discharge:   Follow-up with SNF provider -repeat BMP, CBC in 5 days  Follow-up with podiatry outpatient in 1 week Orthopedic referral provided  Discharge Diagnoses: Principal Problem:   Closed right ankle fracture Active Problems:   Fall at home, initial encounter   Migraines   Celiac disease   Smoker   Bipolar disorder [facility])  Hospital Course: Jasmine Hines is a 69 y.o. female with medical history significant of bipolar disorder, PTSD, smoker, celiac disease, migraine headache, osteoporosis, presented with mechanical fall and right ankle pain.  Denies head and neck injury.  Seen by podiatry, hospital course as below  Closed right ankle fracture:  Seen by podiatry, S/p ORIF with syndesmotic repair 01/07 Pain management with percocet as needed, Robaxin  prn RLE NWB Follow-up in clinic outpatient with Dr. Lennie in 1 week   Mechanical Fall at home -Fall precaution PT/OT rec SNF   Leukocytosis - reactive Suspect reactive postoperative Denies symptoms/signs of infection Monitor WBC   Celiac disease: No nausea, vomiting, diarrhea or abdominal pain gluten-free diet   Smoker: Patient states that she already cut down smoking Nicotine  patch   Bipolar disorder Continue Lamictal , as needed Ativan    Intramedullary lesion tibial metaphysis on the right X-ray showed Ill-defined 1.8 x 1.5 x 2.2 cm intramedullary lesion with calcific matrix in the proximal tibial metaphysis. Etiology is not clear. Per radiologist, this is possibly a chondroid lesion or chronic bone infarct. MRI tib-fib 1.7 x 1.4 x 1.4 cm hyperintense lesion of the proximal tibial metadiaphysis, favoring enchondroma over hemangioma Follow-up with orthopedic outpatient, referral  provided   Pain control - [place]  Controlled Substance Reporting System database was reviewed. and patient was instructed, not to drive, operate heavy machinery, perform activities at heights, swimming or participation in water activities or provide baby-sitting services while on Pain, Sleep and Anxiety Medications; until their outpatient Physician has advised to do so again. Also recommended to not to take more than prescribed Pain, Sleep and Anxiety Medications.  Consultants: Podiatry Procedures performed: S/p ORIF with syndesmotic repair 01/07  Disposition: Skilled nursing facility Diet recommendation:  Discharge Diet Orders (From admission, onward)     Start     Ordered   10/03/24 0000  Diet general       Comments: Gluten-free diet   10/03/24 1410           DISCHARGE MEDICATION: Allergies as of 10/03/2024       Reactions   Codeine Other (See Comments)        Medication List     TAKE these medications    lamoTRIgine  150 MG tablet Commonly known as: LAMICTAL  Take 1 tablet (150 mg total) by mouth 2 (two) times daily.   LORazepam  0.5 MG tablet Commonly known as: ATIVAN  Take 1 tablet (0.5 mg total) by mouth every 8 (eight) hours as needed for anxiety.   methocarbamol  500 MG tablet Commonly known as: ROBAXIN  Take 1 tablet (500 mg total) by mouth every 8 (eight) hours as needed for muscle spasms.   nicotine  21 mg/24hr patch Commonly known as: NICODERM CQ  - dosed in mg/24 hours Place 1 patch (21 mg total) onto the skin daily. Start taking on: October 04, 2024   oxyCODONE -acetaminophen  5-325 MG tablet Commonly known as: PERCOCET/ROXICET  Take 1 tablet by mouth every 6 (six) hours as needed for severe pain (pain score 7-10). What changed:  how much to take when to take this        Follow-up Information     Lennie Barter, DPM Follow up in 1 week(s).   Specialty: Podiatry Contact information: [address] [place] [address] [phone]                 Discharge Exam: Fredricka Weights   09/30/24 1915 10/01/24 0128 10/01/24 1546  Weight: 63.5 kg 73.7 kg 73.7 kg    General: Not in acute distress Cardiac: S1/S2, RRR, No murmurs, No gallops or rubs Respiratory: No rales, wheezing, rhonchi or rubs GI: Soft, nondistended, nontender, no rebound pain, no organomegaly, BS present. Ext: RLE dressing Musculoskeletal: No joint deformities, No joint redness or warmth, no limitation of ROM in spin Skin: No rashes.  Neuro: Alert, oriented X3, no gross focal deficits  Condition at discharge: fair  The results of significant diagnostics from this hospitalization (including imaging, microbiology, ancillary and laboratory) are listed below for reference.   Imaging Studies: DG Ankle 2 Views Right Result Date: 10/01/2024 EXAM: 2 INTRAOPERATIVE FLUOROSCOPIC VIEWS XRAY OF THE RIGHT ANKLE 10/01/2024 05:57:00 PM Fluoroscopy time 58 seconds. Fluoroscopy dose 1.13 microgray. CLINICAL HISTORY: [id number] Surgery, elective [id number] Surgery, elective [phone] COMPARISON: CT of the right ankle 09/30/2024. FINDINGS: BONES AND JOINTS: Lateral side plate and screws fixate a distal fibular fracture. Alignment is anatomic. SOFT TISSUES: Unremarkable. IMPRESSION: 1. Lateral side plate and screws fixate a distal fibular fracture with anatomic alignment. Electronically signed by: Greig Pique MD MD 10/01/2024 08:03 PM EST RP Workstation: [id number]   DG C-Arm 1-60 Min-No Report Result Date: 10/01/2024 Fluoroscopy was utilized by the requesting physician.  No radiographic interpretation.   US  OR NERVE BLOCK-IMAGE ONLY [facility]) Result Date: 10/01/2024 There is no interpretation for this exam.  This order is for images obtained during a surgical procedure.  Please See Surgeries Tab for more information regarding the procedure.   MR TIBIA FIBULA RIGHT W WO CONTRAST Result Date: 10/01/2024 EXAM: MR RIGHT LOWER EXTREMITY WITH AND WITHOUT INTRAVENOUS CONTRAST 10/01/2024  03:32:12 AM TECHNIQUE: Multiplanar magnetic resonance images of the right lower extremity with and without intravenous contrast. CONTRAST: 7.5 mL of Gadavist . COMPARISON: None provided. CLINICAL HISTORY: Ankle fractures, proximal tibial lesion. FINDINGS: SOFT TISSUES: Expected surrounding edema tracking along adjacent fascial planes and in the adjacent subcutaneous tissues. JOINTS: Stage 4 Weber B fracture pattern in the ankle. Expected surrounding edema tracking along adjacent fascial planes and in the adjacent subcutaneous tissues. BONES: 1.7 x 1.4 x 1.4 cm T2 hyperintense lesion of the proximal tibial metadiaphysis with sharply defined and benign imaging characteristics favoring benign enchondroma over benign hemangioma. Stage 4 Weber B fracture pattern in the ankle. LIMITATIONS: Motion artifact. IMPRESSION: 1. Stage 4 Weber B fracture pattern in the ankle with expected surrounding edema. 2. 1.7 x 1.4 x 1.4 cm T2 hyperintense lesion of the proximal tibial metadiaphysis with sharply defined benign imaging characteristics, favoring enchondroma over hemangioma. Electronically signed by: Ryan Salvage MD 10/01/2024 10:56 AM EST RP Workstation: [id number]   CT Ankle Right Wo Contrast Result Date: 09/30/2024 EXAM: CT RIGHT ANKLE, WITHOUT IV CONTRAST 09/30/2024 10:09:43 PM TECHNIQUE: Axial images were acquired through the right ankle without IV contrast. Reformatted images were reviewed. Automated exposure control, iterative reconstruction, and/or weight based adjustment of the mA/kV was utilized to reduce the radiation dose to as low as reasonably achievable. COMPARISON:  Right ankle radiographs 02/23/2025 . CLINICAL HISTORY: Ankle trauma, fracture, xray done (Age >= 5y). Slip and fall injury. FINDINGS: BONES: Oblique fracture of the distal fibula with mild distraction of fracture fragments and about 2 mm lateral displacement of the distal fracture fragment. Oblique fracture of the lateral tibial metaphysis with  fracture line extending to the tibiotalar articular surface. Fracture lines extend to involve the posterior malleolus. There is about 1 mm cortical depression at the tibiotalar articulation. Avulsion fracture off of the anterior tibial metaphysis. Avulsion fragment medial to the distal talus, likely arising from the navicular. Corticated osseous fragment adjacent to the cuboid bone, likely old and united ossicle. JOINTS: No dislocation. Widening of the anterior tibiotalar joint space. The joint spaces are otherwise normal. SOFT TISSUES: Diffuse soft tissue swelling about the ankle. No loculated collections. IMPRESSION: 1. Oblique fracture of the lateral tibial metaphysis with fracture line extending to the tibiotalar articular surface and involving the posterior malleolus, with about 1 mm cortical depression at the tibiotalar articulation. 2. Oblique fracture of the distal fibula with mild distraction and about 2 mm lateral displacement of fracture fragments. 3. Avulsion fracture off of the anterior tibial metaphysis. 4. Widening of the anterior tibiotalar joint space. 5. Avulsion fragment medial to the distal talus, likely arising from the navicular. 6. Diffuse soft tissue swelling about the ankle. No loculated collections. Electronically signed by: Elsie Gravely MD 09/30/2024 10:20 PM EST RP Workstation: [id number]   DG Ankle Complete Right Result Date: 09/30/2024 EXAM: 3 OR MORE VIEW(S) XRAY OF THE RIGHT ANKLE 09/30/2024 07:50:24 PM CLINICAL HISTORY: fall, deformity distal tib fib area COMPARISON: None available. FINDINGS: BONES AND JOINTS: Minimally displaced oblique fracture of the distal fibular metaphysis. No malalignment. SOFT TISSUES: Diffuse soft tissue swelling of the distal lower leg. IMPRESSION: 1. Minimally displaced oblique fracture of the distal fibular metaphysis. 2. Diffuse soft tissue swelling of the distal lower leg. Electronically signed by: Elsie Gravely MD 09/30/2024 08:13 PM EST RP  Workstation: [id number]   DG Tibia/Fibula Right Result Date: 09/30/2024 EXAM: VIEW(S) XRAY OF THE RIGHT TIBIA AND FIBULA 09/30/2024 07:50:24 PM COMPARISON: None available. CLINICAL HISTORY: fall, deformity distal tib fib area FINDINGS: BONES AND JOINTS: Osteopenia. Slightly displaced acute closed distal fibular shaft fracture. No further evidence of fractures in the tibia or fibula . There is a 1.8 x 1.5 x 2.2 cm intramedullary lesion in the proximal tibial metaphysis, which is ill-defined with a calcific matrix and is probably a chondroid lesion or a chronic bone infarct. . . Consider MRI follow-up to assess the potential significance. No malalignment. SOFT TISSUES: Moderate edema anteriorly in the distal foreleg extending to the ankle. IMPRESSION: 1. Slightly displaced acute distal fibular shaft fracture. 2. Moderate edema anteriorly in the distal foreleg extending to the ankle. 3. Ill-defined 1.8 x 1.5 x 2.2 cm intramedullary lesion with calcific matrix in the proximal tibial metaphysis, possibly a chondroid lesion or chronic bone infarct; consider MRI follow-up to assess the potential significance. Electronically signed by: Francis Quam MD 09/30/2024 08:11 PM EST RP Workstation: [id number]   DG Foot 2 Views Right Result Date: 09/30/2024 EXAM: 2 VIEW(S) XRAY OF THE RIGHT FOOT 09/30/2024 07:50:24 PM COMPARISON: None available. CLINICAL HISTORY: fall, deformity distal tib fib area FINDINGS: BONES AND JOINTS: There is osteopenia without evidence of displaced fractures. No malalignment. There is mild arthrosis of the 1st MTP and toe joints. Arthritic changes are not seen significantly elsewhere. There are small plantar and dorsal calcaneal spurs. SOFT TISSUES: There is moderate  edema in the anterior distal foreleg and circumferentially at the ankle and hindfoot. No visible foreign body or soft tissue gas. IMPRESSION: 1. No displaced fracture. 2. Osteopenia and degenerative change. 3. Moderate soft tissue edema  in the anterior distal foreleg and circumferentially at the ankle and hindfoot. Electronically signed by: Francis Quam MD 09/30/2024 08:05 PM EST RP Workstation: [id number]    Microbiology: No results found for this or any previous visit.  Labs: CBC: Recent Labs  Lab 10/01/24 0550 10/02/24 0519 10/03/24 0604  WBC 10.8* 12.2* 12.8*  HGB 12.4 12.1 11.5*  HCT 37.4 37.2 35.3*  MCV 88.4 88.6 90.3  PLT 496* 494* 472*   Basic Metabolic Panel: Recent Labs  Lab 10/01/24 0550 10/02/24 0519  NA 137 136  K 4.2 4.6  CL 104 103  CO2 26 23  GLUCOSE 94 137*  BUN 12 12  CREATININE 0.73 0.75  CALCIUM 8.8* 8.9   Liver Function Tests: No results for input(s): AST, ALT, ALKPHOS, BILITOT, PROT, ALBUMIN in the last 168 hours. CBG: No results for input(s): GLUCAP in the last 168 hours.  Discharge time spent: greater than 30 minutes.  Signed: Laree Lock, MD [facility] 10/03/2024 "

## 2024-10-03 NOTE — Progress Notes (Signed)
 Mobility Specialist - Progress Note    10/03/24 1200  Mobility  Activity Ambulated with assistance;Stood at bedside;Dangled on edge of bed;Moved bed into chair position  Level of Assistance Contact guard assist, steadying assist  Assistive Device Four wheel walker  Distance Ambulated (ft) 35 ft  Range of Motion/Exercises Active  Activity Response Tolerated well  Mobility visit 1 Mobility  Mobility Specialist Start Time (ACUTE ONLY) 1200  Mobility Specialist Stop Time (ACUTE ONLY) 1240  Mobility Specialist Time Calculation (min) (ACUTE ONLY) 40 min   Pt was in the recliner on RA upon entry. Pt agreed to mobility. Pt is able today to STS with 2 WW and minA. Pt ambulated well within the room. Pt did utilize recovery breaks to regain UE support. After activity pt returned to the Bed with needs in reach and bed alarm on. Food services entered the room upon exit.  Clem Rodes Mobility Specialist 10/03/2024, 2:03 PM

## 2024-10-03 NOTE — Plan of Care (Signed)
" °  Problem: Education: Goal: Knowledge of General Education information will improve Description: Including pain rating scale, medication(s)/side effects and non-pharmacologic comfort measures Outcome: Adequate for Discharge   Problem: Health Behavior/Discharge Planning: Goal: Ability to manage health-related needs will improve Outcome: Adequate for Discharge   Problem: Clinical Measurements: Goal: Ability to maintain clinical measurements within normal limits will improve Outcome: Adequate for Discharge Goal: Will remain free from infection Outcome: Adequate for Discharge Goal: Diagnostic test results will improve Outcome: Adequate for Discharge Goal: Respiratory complications will improve Outcome: Adequate for Discharge Goal: Cardiovascular complication will be avoided Outcome: Adequate for Discharge   Problem: Activity: Goal: Risk for activity intolerance will decrease Outcome: Adequate for Discharge   Problem: Nutrition: Goal: Adequate nutrition will be maintained Outcome: Adequate for Discharge   Problem: Coping: Goal: Level of anxiety will decrease Outcome: Adequate for Discharge   Problem: Elimination: Goal: Will not experience complications related to bowel motility Outcome: Adequate for Discharge Goal: Will not experience complications related to urinary retention Outcome: Adequate for Discharge   Problem: Pain Managment: Goal: General experience of comfort will improve and/or be controlled Outcome: Adequate for Discharge   Problem: Safety: Goal: Ability to remain free from injury will improve Outcome: Adequate for Discharge   Problem: Skin Integrity: Goal: Risk for impaired skin integrity will decrease Outcome: Adequate for Discharge   Problem: Education: Goal: Knowledge of the prescribed therapeutic regimen will improve Outcome: Adequate for Discharge   Problem: Bowel/Gastric: Goal: Gastrointestinal status for postoperative course will  improve Outcome: Adequate for Discharge   Problem: Cardiac: Goal: Ability to maintain an adequate cardiac output Outcome: Adequate for Discharge Goal: Will show no evidence of cardiac arrhythmias Outcome: Adequate for Discharge   Problem: Nutritional: Goal: Will attain and maintain optimal nutritional status Outcome: Adequate for Discharge   Problem: Neurological: Goal: Will regain or maintain usual level of consciousness Outcome: Adequate for Discharge   Problem: Clinical Measurements: Goal: Ability to maintain clinical measurements within normal limits Outcome: Adequate for Discharge Goal: Postoperative complications will be avoided or minimized Outcome: Adequate for Discharge   Problem: Respiratory: Goal: Will regain and/or maintain adequate ventilation Outcome: Adequate for Discharge Goal: Respiratory status will improve Outcome: Adequate for Discharge   Problem: Skin Integrity: Goal: Demonstrates signs of wound healing without infection Outcome: Adequate for Discharge   Problem: Urinary Elimination: Goal: Will remain free from infection Outcome: Adequate for Discharge Goal: Ability to achieve and maintain adequate urine output Outcome: Adequate for Discharge   "

## 2024-10-03 NOTE — Progress Notes (Signed)
 Physical Therapy Treatment Patient Details Name: Jasmine Hines MRN: [id number] DOB: 1956/03/05 Today's Date: 10/03/2024   History of Present Illness Jasmine Hines is a 69 y.o. female with medical history significant of bipolar disorder, PTSD, smoker, celiac disease, migraine headache, osteoporosis, who presents with fall and right ankle pain, imaging revealed R ankle fx, underwent ORIF to RLE with Dr. Lennie on 10/01/24. She is NWB.    PT Comments  Pt was long sitting in bed upon arrival. She is A and O but easily distracted and needs constant cues to stay focused on desired task. PT is impulsive at times and did endorse having fall previous night. She requested to urinate. Was able to stand pivot to [facility] prior to standing and hopping to recliner ~ 20 ft with constant vcs for improved technique and safety. Pt will benefit form continued skilled PT to maximize independence and safety with all ADLs.     If plan is discharge home, recommend the following: A little help with walking and/or transfers;A little help with bathing/dressing/bathroom;Assistance with cooking/housework;Assist for transportation;Help with stairs or ramp for entrance     Equipment Recommendations  Other (comment) (Defer to next level of care)       Precautions / Restrictions Precautions Precautions: Fall Recall of Precautions/Restrictions: Impaired Restrictions Weight Bearing Restrictions Per Provider Order: Yes RLE Weight Bearing Per Provider Order: Non weight bearing     Mobility  Bed Mobility Overal bed mobility: Modified Independent  General bed mobility comments: pt was able to achieve EOIB sitting with increased time and vcs for technique improvements to avoid wt on RLE    Transfers Overall transfer level: Needs assistance Equipment used: Rolling walker (2 wheels) Transfers: Sit to/from Stand, Bed to chair/wheelchair/BSC Sit to Stand: Contact guard assist, Min assist Stand pivot transfers: Contact guard  assist   Ambulation/Gait Ambulation/Gait assistance: Min assist Gait Distance (Feet): 20 Feet Assistive device: Rolling walker (2 wheels) Gait Pattern/deviations: Step-to pattern Gait velocity: decreased  General Gait Details: Pt was able to hop from L side of bed and easily hop to recliner placed on opposite side of bed.   Balance Overall balance assessment: Needs assistance Sitting-balance support: Feet supported, No upper extremity supported Sitting balance-Leahy Scale: Normal     Standing balance support: Bilateral upper extremity supported, Reliant on assistive device for balance Standing balance-Leahy Scale: Poor Standing balance comment: high fall risk due to impulsivity and anxiousness       Communication Communication Communication: No apparent difficulties  Cognition Arousal: Alert Behavior During Therapy: Anxious (talkative/ easily distracted)   PT - Cognitive impairments: No family/caregiver present to determine baseline      PT - Cognition Comments: Pt is A and O but needs constant redirecting to stay focused on desired task Following commands: Impaired Following commands impaired: Follows one step commands with increased time    Cueing Cueing Techniques: Verbal cues, Tactile cues         Pertinent Vitals/Pain Pain Assessment Pain Assessment: No/denies pain Pain Score: 0-No pain Pain Location: R ankle Pain Descriptors / Indicators: Discomfort Pain Intervention(s): Limited activity within patient's tolerance, Monitored during session, RN gave pain meds during session, Repositioned     PT Goals (current goals can now be found in the care plan section) Acute Rehab PT Goals Patient Stated Goal: none stated Progress towards PT goals: Progressing toward goals    Frequency    7X/week       AM-PAC PT 6 Clicks Mobility   Outcome Measure  Help needed turning from your back to your side while in a flat bed without using bedrails?: None Help needed  moving from lying on your back to sitting on the side of a flat bed without using bedrails?: None Help needed moving to and from a bed to a chair (including a wheelchair)?: A Little Help needed standing up from a chair using your arms (e.g., wheelchair or bedside chair)?: A Little Help needed to walk in hospital room?: A Little Help needed climbing 3-5 steps with a railing? : Total 6 Click Score: 18    End of Session   Activity Tolerance: Patient tolerated treatment well Patient left: with chair alarm set;with call bell/phone within reach Nurse Communication: Mobility status PT Visit Diagnosis: Muscle weakness (generalized) (M62.81);Difficulty in walking, not elsewhere classified (R26.2);Unsteadiness on feet (R26.81);Pain;Other abnormalities of gait and mobility (R26.89)     Time: 9252-9185 PT Time Calculation (min) (ACUTE ONLY): 27 min  Charges:    $Gait Training: 8-22 mins $Therapeutic Activity: 8-22 mins PT General Charges $$ ACUTE PT VISIT: 1 Visit                    Rankin Essex PTA 10/03/2024, 9:16 AM

## 2024-10-03 NOTE — Plan of Care (Signed)
  Problem: Nutrition: Goal: Adequate nutrition will be maintained Outcome: Progressing   Problem: Activity: Goal: Risk for activity intolerance will decrease Outcome: Progressing   Problem: Pain Managment: Goal: General experience of comfort will improve and/or be controlled Outcome: Progressing   Problem: Safety: Goal: Ability to remain free from injury will improve Outcome: Progressing

## 2024-10-03 NOTE — TOC Transition Note (Signed)
 Transition of Care [facility]) - Discharge Note   Patient Details  Name: Jasmine Hines MRN: [id number] Date of Birth: [birth year]  [facility] CM/SW Contact:  Alvaro Louder, LCSW Phone Number: 10/03/2024, 2:41 PM   Clinical Narrative:   LCSWA received insurance approval for patient to admit to SNF. LCSWA confirmed with MD that patient is stable for discharge. LCSWA notified the patient  and they are in agreement with discharge. LCSWA confirmed bed is available at [facility] arranged with [facility] for next available.    Number to call report [phone] RM [phone]   TOC signing off  Final next level of care: Skilled Nursing Facility Barriers to Discharge: No Barriers Identified   Patient Goals and CMS Choice            Discharge Placement              Patient chooses bed at: [facility] Patient to be transferred to facility by: [facility] Name of family member notified: Self Patient and family notified of of transfer: 10/03/24  Discharge Plan and Services Additional resources added to the After Visit Summary for                                       Social Drivers of Health (SDOH) Interventions SDOH Screenings   Food Insecurity: No Food Insecurity (10/01/2024)  Housing: Low Risk (10/01/2024)  Transportation Needs: No Transportation Needs (10/01/2024)  Utilities: Not At Risk (10/01/2024)  Social Connections: Socially Isolated (10/01/2024)  Tobacco Use: High Risk (10/01/2024)     Readmission Risk Interventions     No data to display
